# Patient Record
Sex: Male | Born: 1985 | Race: White | Hispanic: No | Marital: Married | Smoking: Current some day smoker
Health system: Southern US, Community
[De-identification: ages and names within clinical notes are randomized; demographics above are authoritative.]

## PROBLEM LIST (undated history)

## (undated) DIAGNOSIS — N200 Calculus of kidney: Secondary | ICD-10-CM

## (undated) HISTORY — DX: Calculus of kidney: N20.0

## (undated) HISTORY — DX: Morbid (severe) obesity due to excess calories: E66.01

---

## 2009-02-21 ENCOUNTER — Emergency Department: Payer: Self-pay | Admitting: Emergency Medicine

## 2015-05-27 ENCOUNTER — Telehealth: Payer: Self-pay | Admitting: Family Medicine

## 2015-05-27 NOTE — Telephone Encounter (Signed)
I do not see this in the patient's PP chart. I this the correct patient?

## 2015-05-27 NOTE — Telephone Encounter (Signed)
Refill Buprioprion [employer].

## 2015-05-30 MED ORDER — NALTREXONE-BUPROPION HCL ER 8-90 MG PO TB12: 2.0000 | ORAL_TABLET | Freq: Two times a day (BID) | ORAL | Status: DC

## 2015-05-30 NOTE — Telephone Encounter (Signed)
Unless there is another Constance Holster with same dob and phone number.  It is in his Epic chart.  I don't know if Dr Laural Benes was the original prescribe but pt said Dr Laural Benes told him to call when he was running low and she would refill it.

## 2015-05-30 NOTE — Telephone Encounter (Signed)
Rx sent to his pharmacy. Contrave. Not wellbutrin.

## 2015-12-21 ENCOUNTER — Ambulatory Visit (INDEPENDENT_AMBULATORY_CARE_PROVIDER_SITE_OTHER): Payer: Self-pay | Admitting: Unknown Physician Specialty

## 2015-12-21 ENCOUNTER — Encounter: Payer: Self-pay | Admitting: Unknown Physician Specialty

## 2015-12-21 VITALS — BP 139/84 | HR 94 | Temp 98.3°F | Ht 67.7 in | Wt 307.6 lb

## 2015-12-21 DIAGNOSIS — Z0000 Encounter for general adult medical examination without abnormal findings: Secondary | ICD-10-CM

## 2015-12-21 LAB — URINALYSIS, DIPSTICK ONLY
Bilirubin, UA: NEGATIVE
Glucose, UA: NEGATIVE
Ketones, UA: NEGATIVE
Leukocytes, UA: NEGATIVE
Nitrite, UA: NEGATIVE
Protein, UA: NEGATIVE
RBC, UA: NEGATIVE
Specific Gravity, UA: 1.02 (ref 1.005–1.030)
Urobilinogen, Ur: 0.2 mg/dL (ref 0.2–1.0)
pH, UA: 6 (ref 5.0–7.5)

## 2015-12-21 NOTE — Progress Notes (Signed)
   BP 139/84 mmHg  Pulse 94  Temp(Src) 98.3 F (36.8 C)  Ht 5' 7.7" (1.72 m)  Wt 307 lb 9.6 oz (139.526 kg)  BMI 47.16 kg/m2  SpO2 98%   Subjective:    Patient ID: Edward Price, male    DOB: [birth year], 30 y.o.   MRN: [id number]  HPI: Edward Price is a 30 y.o. male  Chief Complaint  Patient presents with  . Employment Physical    DOT physical     Relevant past medical, surgical, family and social history reviewed and updated as indicated. Interim medical history since our last visit reviewed. Allergies and medications reviewed and updated.  Review of Systems  Per HPI unless specifically indicated above     Objective:    BP 139/84 mmHg  Pulse 94  Temp(Src) 98.3 F (36.8 C)  Ht 5' 7.7" (1.72 m)  Wt 307 lb 9.6 oz (139.526 kg)  BMI 47.16 kg/m2  SpO2 98%  Wt Readings from Last 3 Encounters:  12/21/15 307 lb 9.6 oz (139.526 kg)  09/14/14 298 lb (135.172 kg)    Physical Exam  Constitutional: He is oriented to person, place, and time. He appears well-developed and well-nourished.  HENT:  Head: Normocephalic.  Right Ear: Tympanic membrane, external ear and ear canal normal.  Left Ear: Tympanic membrane, external ear and ear canal normal.  Mouth/Throat: Uvula is midline, oropharynx is clear and moist and mucous membranes are normal.  Eyes: Pupils are equal, round, and reactive to light.  Cardiovascular: Normal rate, regular rhythm and normal heart sounds.  Exam reveals no gallop and no friction rub.   No murmur heard. Pulmonary/Chest: Effort normal and breath sounds normal. No respiratory distress.  Abdominal: Soft. Bowel sounds are normal. He exhibits no distension. There is no tenderness.  Musculoskeletal: Normal range of motion.  Neurological: He is alert and oriented to person, place, and time. He has normal reflexes.  Skin: Skin is warm and dry.  Psychiatric: He has a normal mood and affect. His behavior is normal. Judgment and thought content normal.    No  results found for this or any previous visit.    Assessment & Plan:   Problem List Items Addressed This Visit    None    Visit Diagnoses    Routine general medical examination at a health care facility    -  Primary    Relevant Orders    Urinalysis, dipstick only       DOT (see form)  Follow up plan: No Follow-up on file.

## 2016-03-21 ENCOUNTER — Encounter: Payer: Self-pay | Admitting: Family Medicine

## 2016-03-21 ENCOUNTER — Ambulatory Visit (INDEPENDENT_AMBULATORY_CARE_PROVIDER_SITE_OTHER): Payer: BLUE CROSS/BLUE SHIELD | Admitting: Family Medicine

## 2016-03-21 VITALS — BP 132/82 | HR 80 | Temp 98.5°F | Wt 311.0 lb

## 2016-03-21 DIAGNOSIS — M2550 Pain in unspecified joint: Secondary | ICD-10-CM | POA: Diagnosis not present

## 2016-03-21 MED ORDER — NALTREXONE-BUPROPION HCL ER 8-90 MG PO TB12: 2.0000 | ORAL_TABLET | Freq: Two times a day (BID) | ORAL | Status: DC

## 2016-03-21 MED ORDER — MELOXICAM 7.5 MG PO TABS: 7.5000 mg | ORAL_TABLET | Freq: Every day | ORAL | Status: DC

## 2016-03-21 NOTE — Patient Instructions (Signed)
Follow up as needed

## 2016-03-21 NOTE — Progress Notes (Signed)
BP 132/82 mmHg  Pulse 80  Temp(Src) 98.5 F (36.9 C)  Wt 311 lb (141.069 kg)  SpO2 98%   Subjective:    Patient ID: Edward Price, male    DOB: [birth year], 31 y.o.   MRN: [id number]  HPI: Edward Price is a 30 y.o. male  Chief Complaint  Patient presents with  . Hand Pain    bilateral hands x 2 months. right hand is worse, radiates into right arm and all the way down into the hand. numbness and tingling. If he doesn't use his hands they swell and get more painful. No known injury.   B/l hand pain x 2 months that becomes much worse with rest. Never happened to him before. 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 The pain begins up around shoulders and comes all the way down arm to entire hand. Some numbness and tingling when swelling is at it's peak. Right side worse than left. Early morning is the worst. Has tried 2 ibuprofen and a heating pad applied to hands, gets relief from that.  Denies fever, fatigue, rashes, GI complaints.   Has also noted significant weight gain since being off Contrave and grieving the loss of several family members recently. Did very well on contrave last year and was able to lose about 40 lb on it. Would like to get back on the medication at this time.   Fhx - MGF and mother both have arthritis, unsure what type.    Relevant past medical, surgical, family and social history reviewed and updated as indicated. Interim medical history since our last visit reviewed. Allergies and medications reviewed and updated.  Review of Systems  Constitutional: Negative.   Eyes: Negative.   Respiratory: Negative.   Cardiovascular: Negative.   Gastrointestinal: Negative.   Musculoskeletal: Positive for joint swelling and arthralgias.  Skin: Negative.   Neurological: Positive for numbness. Negative for weakness.  Psychiatric/Behavioral: Negative.     Per HPI  unless specifically indicated above     Objective:    BP 132/82 mmHg  Pulse 80  Temp(Src) 98.5 F (36.9 C)  Wt 311 lb (141.069 kg)  SpO2 98%  Wt Readings from Last 3 Encounters:  03/21/16 311 lb (141.069 kg)  12/21/15 307 lb 9.6 oz (139.526 kg)  09/14/14 298 lb (135.172 kg)    Physical Exam  Constitutional: He is oriented to person, place, and time. He appears well-developed and well-nourished. No distress.  HENT:  Head: Atraumatic.  Eyes: Conjunctivae are normal. No scleral icterus.  Neck: Normal range of motion. Neck supple.  Cardiovascular: Normal rate and normal heart sounds.   Pulmonary/Chest: Effort normal.  Abdominal: Soft.  Musculoskeletal: Normal range of motion. He exhibits edema (b/l hands mildly edematous diffusely).  Neurological: He is alert and oriented to person, place, and time.  Skin: Skin is warm and dry. No rash noted.  Psychiatric: He has a normal mood and affect. His behavior is normal.  Nursing note and vitals reviewed.   Results for orders placed or performed in visit on 12/21/15  Urinalysis, dipstick only  Result Value Ref Range   Specific Gravity, UA 1.020 1.005 - 1.030   pH, UA 6.0 5.0 - 7.5   Color, UA Yellow Yellow   Appearance Ur Cloudy (A) Clear   Leukocytes, UA Negative Negative   Protein, UA Negative Negative/Trace   Glucose, UA Negative  Negative   Ketones, UA Negative Negative   RBC, UA Negative Negative   Bilirubin, UA Negative Negative   Urobilinogen, Ur 0.2 0.2 - 1.0 mg/dL   Nitrite, UA Negative Negative      Assessment & Plan:   Problem List Items Addressed This Visit      Other   Morbid obesity ([place])   Relevant Medications   Naltrexone-Bupropion HCl ER 8-90 MG TB12    Other Visit Diagnoses    Joint pain    -  Primary    Physical exam non-specific - await lab results, 7.5 mg meloxicam sent - pt will take two tablets daily if tolerated. Cont. heating pad     Relevant Orders    Rheumatoid Factor    Antinuclear Antib  (ANA)    Sed Rate (ESR)    CBC with Differential/Platelet    CYCLIC CITRUL PEPTIDE ANTIBODY, IGG/IGA      Pt states he is very sensitive to medications so will start him on a trial of 7.8m meloxicam and if well tolerated, he can try taking two. Discussed to stop ibuprofen and all other OTC pain relievers other than tylenol while on it.   Consider rheumatology referral if no relief and/or positive lab results.   Follow up plan: No Follow-up on file.

## 2016-03-22 LAB — CBC WITH DIFFERENTIAL/PLATELET
BASOS: 0 %
Basophils Absolute: 0 10*3/uL (ref 0.0–0.2)
EOS (ABSOLUTE): 0.2 10*3/uL (ref 0.0–0.4)
Eos: 2 %
HEMATOCRIT: 47.5 % (ref 37.5–51.0)
Hemoglobin: 16.1 g/dL (ref 12.6–17.7)
Immature Grans (Abs): 0 10*3/uL (ref 0.0–0.1)
Immature Granulocytes: 0 %
LYMPHS ABS: 3.2 10*3/uL — AB (ref 0.7–3.1)
Lymphs: 41 %
MCH: 30.3 pg (ref 26.6–33.0)
MCHC: 33.9 g/dL (ref 31.5–35.7)
MCV: 89 fL (ref 79–97)
MONOS ABS: 0.6 10*3/uL (ref 0.1–0.9)
Monocytes: 8 %
NEUTROS PCT: 49 %
Neutrophils Absolute: 3.9 10*3/uL (ref 1.4–7.0)
Platelets: 294 10*3/uL (ref 150–379)
RBC: 5.32 x10E6/uL (ref 4.14–5.80)
RDW: 13.9 % (ref 12.3–15.4)
WBC: 7.9 10*3/uL (ref 3.4–10.8)

## 2016-03-22 LAB — SEDIMENTATION RATE: SED RATE: 4 mm/h (ref 0–15)

## 2016-03-22 LAB — ANA: Anti Nuclear Antibody(ANA): NEGATIVE

## 2016-03-22 LAB — RHEUMATOID FACTOR: Rhuematoid fact SerPl-aCnc: <10 IU/mL (ref 0.0–13.9)

## 2016-03-23 ENCOUNTER — Encounter: Payer: Self-pay | Admitting: Family Medicine

## 2016-03-23 ENCOUNTER — Telehealth: Payer: Self-pay

## 2016-03-23 LAB — CYCLIC CITRUL PEPTIDE ANTIBODY, IGG/IGA: Cyclic Citrullin Peptide Ab: 8 units (ref 0–19)

## 2016-03-23 NOTE — Telephone Encounter (Signed)
Will see what Dr. Laural BenesJohnson is comfortable with on Monday. Thanks for trying!

## 2016-03-23 NOTE — Telephone Encounter (Signed)
Received as Drug Change Request from [facility] regarding patient's Contrave. Suggested to change to a different medication because it's not covered by patient's insurance.  I tried to do a Prior Authorization and it was denied. Super hard to get weight loss medication approved unless they have hypothyroidism.   Change patient's contrave to something else or wait till Monday when Dr. Henriette CombsJohnson's back in office?

## 2016-03-26 NOTE — Telephone Encounter (Signed)
Patient notified, he will call insurance company and see what is covered.

## 2016-03-26 NOTE — Telephone Encounter (Signed)
He will need to check with his insurance to see if they will cover anything. Unfortunately, some insurances cover weight loss medicine and some don't. If they don't cover any of them, then he can pay out of pocket with a savings card if he wants to pay that much money, if not we can send him to the weight loss clinic or I can see him specifically for that to discuss other options.

## 2016-03-27 NOTE — Telephone Encounter (Signed)
He will need to come in for an appointment or he can check with the pharmacy.

## 2016-03-27 NOTE — Telephone Encounter (Signed)
Pt states that his insurance company would not recommend a different drug since they do not cover Contrave.  Pt's insurance company advised patient to contact ([place])  the provider to obtain a new prescription.  Please send a new prescription to [facility] that the patient's insurance will cover.

## 2016-03-28 NOTE — Telephone Encounter (Signed)
Appointment scheduled.

## 2016-04-18 ENCOUNTER — Encounter: Payer: Self-pay | Admitting: Family Medicine

## 2016-04-18 ENCOUNTER — Ambulatory Visit (INDEPENDENT_AMBULATORY_CARE_PROVIDER_SITE_OTHER): Payer: BLUE CROSS/BLUE SHIELD | Admitting: Family Medicine

## 2016-04-18 MED ORDER — LORCASERIN HCL 10 MG PO TABS: 1.0000 | ORAL_TABLET | Freq: Two times a day (BID) | ORAL | 1 refills | Status: DC

## 2016-04-18 NOTE — Progress Notes (Signed)
BP 135/84 (BP Location: Left Arm, Patient Position: Sitting, Cuff Size: Large)   Pulse 80   Temp 98.7 F (37.1 C)   Wt (!) 313 lb (142 kg)   SpO2 95%   BMI 48.01 kg/m    Subjective:    Patient ID: Edward Price, male    DOB: 1986-05-22, 30 y.o.   MRN: [id number]  HPI: Edward Price is a 30 y.o. male  Chief Complaint  Patient presents with  . Obesity    Patient's insurance will cover Belviq and Qsymia   WEIGHT GAIN- fell off the wagon when his grandfather passed away. Has been gaining weight again.  Duration: chronic Previous attempts at weight loss: yes Complications of obesity: joint pain  Weight loss goal: 100lbs Weight loss to date: -8 lbs Requesting obesity pharmacotherapy: yes Current weight loss supplements/medications: no Previous weight loss supplements/meds: yes- did very well on contrave, but not covered by his insurance any more  Relevant past medical, surgical, family and social history reviewed and updated as indicated. Interim medical history since our last visit reviewed. Allergies and medications reviewed and updated.  Review of Systems  Constitutional: Negative.   Respiratory: Negative.   Cardiovascular: Negative.   Psychiatric/Behavioral: Negative.     Per HPI unless specifically indicated above     Objective:    BP 135/84 (BP Location: Left Arm, Patient Position: Sitting, Cuff Size: Large)   Pulse 80   Temp 98.7 F (37.1 C)   Wt (!) 313 lb (142 kg)   SpO2 95%   BMI 48.01 kg/m   Wt Readings from Last 3 Encounters:  04/18/16 (!) 313 lb (142 kg)  03/21/16 (!) 311 lb (141.1 kg)  12/21/15 (!) 307 lb 9.6 oz (139.5 kg)    Physical Exam  Constitutional: He is oriented to person, place, and time. He appears well-developed and well-nourished. No distress.  HENT:  Head: Normocephalic and atraumatic.  Right Ear: Hearing normal.  Left Ear: Hearing normal.  Nose: Nose normal.  Eyes: Conjunctivae and lids are normal. Right eye exhibits no  discharge. Left eye exhibits no discharge. No scleral icterus.  Pulmonary/Chest: Effort normal. No respiratory distress.  Musculoskeletal: Normal range of motion.  Neurological: He is alert and oriented to person, place, and time.  Skin: Skin is warm, dry and intact. No rash noted. No erythema. No pallor.  Psychiatric: He has a normal mood and affect. His speech is normal and behavior is normal. Judgment and thought content normal. Cognition and memory are normal.  Nursing note and vitals reviewed.   Results for orders placed or performed in visit on 03/21/16  Rheumatoid Factor  Result Value Ref Range   Rhuematoid fact SerPl-aCnc <10.0 0.0 - 13.9 IU/mL  Antinuclear Antib (ANA)  Result Value Ref Range   Anit Nuclear Antibody(ANA) Negative Negative  Sed Rate (ESR)  Result Value Ref Range   Sed Rate 4 0 - 15 mm/hr  CBC with Differential/Platelet  Result Value Ref Range   WBC 7.9 3.4 - 10.8 x10E3/uL   RBC 5.32 4.14 - 5.80 x10E6/uL   Hemoglobin 16.1 12.6 - 17.7 g/dL   Hematocrit 47.5 37.5 - 51.0 %   MCV 89 79 - 97 fL   MCH 30.3 26.6 - 33.0 pg   MCHC 33.9 31.5 - 35.7 g/dL   RDW 13.9 12.3 - 15.4 %   Platelets 294 150 - 379 x10E3/uL   Neutrophils 49 %   Lymphs 41 %   Monocytes 8 %  Eos 2 %   Basos 0 %   Neutrophils Absolute 3.9 1.4 - 7.0 x10E3/uL   Lymphocytes Absolute 3.2 (H) 0.7 - 3.1 x10E3/uL   Monocytes Absolute 0.6 0.1 - 0.9 x10E3/uL   EOS (ABSOLUTE) 0.2 0.0 - 0.4 x10E3/uL   Basophils Absolute 0.0 0.0 - 0.2 x10E3/uL   Immature Granulocytes 0 %   Immature Grans (Abs) 0.0 0.0 - 0.1 P10Y3/EJ  CYCLIC CITRUL PEPTIDE ANTIBODY, IGG/IGA  Result Value Ref Range   Cyclic Citrullin Peptide Ab 8 0 - 19 units      Assessment & Plan:   Problem List Items Addressed This Visit      Other   Morbid obesity ([place]) - Primary    Will get him started on belviq. Risks and benefits discussed. Will check back in in 1 month to determine tolerance.      Relevant Medications   Lorcaserin  HCl (BELVIQ) 10 MG TABS    Other Visit Diagnoses   None.      Follow up plan: Return in about 4 weeks (around 05/16/2016).

## 2016-04-18 NOTE — Patient Instructions (Addendum)
Lorcaserin oral tablets What is this medicine? LORCASERIN (lor ca SER in) is used to promote and maintain weight loss in obese patients. This medicine should be used with a reduced calorie diet and, if appropriate, an exercise program. This medicine may be used for other purposes; ask your health care provider or pharmacist if you have questions. What should I tell my health care provider before I take this medicine? They need to know if you have any of these conditions: -anatomical deformation of the penis, Peyronie's disease, or history of priapism (painful and prolonged erection) -diabetes -heart disease -history of blood diseases, like sickle cell anemia or leukemia -history of irregular heartbeat -kidney disease -liver disease -suicidal thoughts, plans, or attempt; a previous suicide attempt by you or a family member -an unusual or allergic reaction to lorcaserin, other medicines, foods, dyes, or preservatives -pregnant or trying to get pregnant -breast-feeding How should I use this medicine? Take this medicine by mouth with a glass of water. Follow the directions on the prescription label. You can take it with or without food. Take your medicine at regular intervals. Do not take it more often than directed. Do not stop taking except on your doctor's advice. Talk to your pediatrician regarding the use of this medicine in children. Special care may be needed. Overdosage: If you think you have taken too much of this medicine contact a poison control center or emergency room at once. NOTE: This medicine is only for you. Do not share this medicine with others. What if I miss a dose? If you miss a dose, take it as soon as you can. If it is almost time for your next dose, take only that dose. Do not take double or extra doses. What may interact with this medicine? -cabergoline -certain medicines for depression, anxiety, or psychotic disturbances -certain medicines for erectile  dysfunction -certain medicines for migraine headache like almotriptan, eletriptan, frovatriptan, naratriptan, rizatriptan, sumatriptan, zolmitriptan -dextromethorphan -linezolid -lithium -medicines for diabetes -other weight loss products -tramadol -St. John's Wort -stimulant medicines for attention disorders, weight loss, or to stay awake -tryptophan This list may not describe all possible interactions. Give your health care provider a list of all the medicines, herbs, non-prescription drugs, or dietary supplements you use. Also tell them if you smoke, drink alcohol, or use illegal drugs. Some items may interact with your medicine. What should I watch for while using this medicine? This medicine is intended to be used in addition to a healthy diet and appropriate exercise. The best results are achieved this way. Your doctor should instruct you to stop taking this medicine if you do not lose a certain amount of weight within the first 12 weeks of treatment, but it is important that you do not change your dose in any way without consulting your doctor or health care professional. Visit your doctor or health care professional for regular checkups. Your doctor may order blood tests or other tests to see how you are doing. Do not drive, use machinery, or do anything that needs mental alertness until you know how this medicine affects you. This medicine may affect blood sugar levels. If you have diabetes, check with your doctor or health care professional before you change your diet or the dose of your diabetic medicine. Patients and their families should watch out for worsening depression or thoughts of suicide. Also watch out for sudden changes in feelings such as feeling anxious, agitated, panicky, irritable, hostile, aggressive, impulsive, severely restless, overly excited and hyperactive, or   not being able to sleep. If this happens, especially at the beginning of treatment or after a change in dose,  call your health care professional. Contact your doctor or health care professional right away if you are a man with an erection that lasts longer than 4 hours or if the erection becomes painful. This may be a sign of serious problem and must be treated right away to prevent permanent damage. What side effects may I notice from receiving this medicine? Side effects that you should report to your doctor or health care professional as soon as possible: -allergic reactions like skin rash, itching or hives, swelling of the face, lips, or tongue -abnormal production of milk -breast enlargement in both males and females -breathing problems -changes in emotions or moods -changes in vision -confusion -erection lasting more than 4 hours or a painful erection -fast or irregular heart beat -feeling faint or lightheaded, falls -fever or chills, sore throat -hallucination, loss of contact with reality -high or low blood pressure -menstrual changes -restlessness -slow or irregular heartbeat -stiff muscles -sweating -suicidal thoughts or other mood changes -swelling of the ankles, feet, hands -unusually weak or tired -vomiting Side effects that usually do not require medical attention (Report these to your doctor or health care professional if they continue or are bothersome.): -back pain -constipation -cough -dry mouth -nausea -tiredness This list may not describe all possible side effects. Call your doctor for medical advice about side effects. You may report side effects to FDA at 1-800-FDA-1088. Where should I keep my medicine? Keep out of the reach of children. This medicine can be abused. Keep your medicine in a safe place to protect it from theft. Do not share this medicine with anyone. Selling or giving away this medicine is dangerous and against the law. Store at room temperature between 15 and 30 degrees C (59 and 86 degrees F). Throw away any unused medicine after the expiration  date. NOTE: This sheet is a summary. It may not cover all possible information. If you have questions about this medicine, talk to your doctor, pharmacist, or health care provider.    2016, [employer]vier/[employer] Standard. (2015-03-28 16:21:05)  

## 2016-04-18 NOTE — Assessment & Plan Note (Signed)
Will get him started on belviq. Risks and benefits discussed. Will check back in in 1 month to determine tolerance.

## 2016-05-14 ENCOUNTER — Telehealth: Payer: BLUE CROSS/BLUE SHIELD | Admitting: Family

## 2016-05-14 DIAGNOSIS — J208 Acute bronchitis due to other specified organisms: Secondary | ICD-10-CM

## 2016-05-14 MED ORDER — BENZONATATE 100 MG PO CAPS: 100.0000 mg | ORAL_CAPSULE | Freq: Two times a day (BID) | ORAL | 0 refills | Status: DC | PRN

## 2016-05-14 NOTE — Progress Notes (Signed)

## 2016-05-23 ENCOUNTER — Ambulatory Visit: Payer: BLUE CROSS/BLUE SHIELD | Admitting: Family Medicine

## 2016-06-07 ENCOUNTER — Ambulatory Visit: Payer: BLUE CROSS/BLUE SHIELD | Admitting: Family Medicine

## 2016-10-02 ENCOUNTER — Ambulatory Visit (INDEPENDENT_AMBULATORY_CARE_PROVIDER_SITE_OTHER): Payer: BLUE CROSS/BLUE SHIELD | Admitting: Family Medicine

## 2016-10-02 ENCOUNTER — Encounter: Payer: Self-pay | Admitting: Family Medicine

## 2016-10-02 VITALS — BP 124/78 | HR 79 | Temp 98.0°F | Ht 68.5 in | Wt 316.9 lb

## 2016-10-02 DIAGNOSIS — Z23 Encounter for immunization: Secondary | ICD-10-CM

## 2016-10-02 DIAGNOSIS — Z6841 Body Mass Index (BMI) 40.0 and over, adult: Secondary | ICD-10-CM | POA: Diagnosis not present

## 2016-10-02 DIAGNOSIS — Z0000 Encounter for general adult medical examination without abnormal findings: Secondary | ICD-10-CM

## 2016-10-02 LAB — UA/M W/RFLX CULTURE, ROUTINE
BILIRUBIN UA: NEGATIVE
KETONES UA: NEGATIVE
LEUKOCYTES UA: NEGATIVE
Nitrite, UA: NEGATIVE
Protein, UA: NEGATIVE
RBC UA: NEGATIVE
SPEC GRAV UA: 1.02 (ref 1.005–1.030)
Urobilinogen, Ur: 0.2 mg/dL (ref 0.2–1.0)
pH, UA: 6.5 (ref 5.0–7.5)

## 2016-10-02 LAB — MICROSCOPIC EXAMINATION
Epithelial Cells (non renal): NONE SEEN /hpf (ref 0–10)
RBC, UA: NONE SEEN /hpf (ref 0–?)
WBC, UA: NONE SEEN /hpf (ref 0–?)

## 2016-10-02 MED ORDER — NALTREXONE-BUPROPION HCL ER 8-90 MG PO TB12: ORAL_TABLET | ORAL | 3 refills | Status: DC

## 2016-10-02 NOTE — Assessment & Plan Note (Signed)
Did not do well with belviq- no benefit. Did great with contrave previously. Will try it again. Rx sent to his pharmacy. Recheck tolerance and weight loss in 1 month. Work on diet and exercise.

## 2016-10-02 NOTE — Progress Notes (Signed)
BP 124/78 (BP Location: Left Arm, Patient Position: Sitting, Cuff Size: Large)   Pulse 79   Temp 98 F (36.7 C)   Ht 5' 8.5" (1.74 m)   Wt (!) 316 lb 14.4 oz (143.7 kg)   SpO2 96%   BMI 47.48 kg/m    Subjective:    Patient ID: Edward Price, male    DOB: 1986/06/13, 31 y.o.   MRN: [id number]  HPI: Edward Price is a 31 y.o. male presenting on 10/02/2016 for comprehensive medical examination. Current medical complaints include:  WEIGHT GAIN Duration: chronic Previous attempts at weight loss: yes Complications of obesity:  Peak weight: 320s Weight loss goal: 70lbs Weight loss to date:  none Requesting obesity pharmacotherapy: yes Current weight loss supplements/medications: no Previous weight loss supplements/meds: yes  He currently lives with: Wife Interim Problems from his last visit: no  Depression Screen done today and results listed below:  Depression screen [facility] 2/9 10/02/2016 04/18/2016  Decreased Interest 0 0  Down, Depressed, Hopeless 0 0  PHQ - 2 Score 0 0  Altered sleeping 0 0  Tired, decreased energy 0 0  Change in appetite 1 2  Feeling bad or failure about yourself  0 0  Trouble concentrating 0 0  Moving slowly or fidgety/restless 0 0  Suicidal thoughts 0 0  PHQ-9 Score 1 2    Past Medical History:  Past Medical History:  Diagnosis Date  . Morbid obesity ([place])   . Nephrolithiasis     Surgical History:  No past surgical history on file.  Medications:  No current outpatient prescriptions on file prior to visit.   No current facility-administered medications on file prior to visit.     Allergies:  No Known Allergies  Social History:  Social History   Social History  . Marital status: Married    Spouse name: N/A  . Number of children: N/A  . Years of education: N/A   Occupational History  . Not on file.   Social History Main Topics  . Smoking status: Former Smoker    Types: Cigarettes  . Smokeless tobacco: Current User    Types:  Chew  . Alcohol use No  . Drug use: No  . Sexual activity: Yes   Other Topics Concern  . Not on file   Social History Narrative  . No narrative on file   History  Smoking Status  . Former Smoker  . Types: Cigarettes  Smokeless Tobacco  . Current User  . Types: Chew   History  Alcohol Use No    Family History:  Family History  Problem Relation Age of Onset  . Diabetes Mother   . Hypertension Mother   . Diabetes Father   . Hypertension Father   . Heart disease Maternal Grandmother   . Hypertension Maternal Grandmother   . Cancer Maternal Grandfather     liver  . Heart disease Maternal Grandfather   . Hypertension Maternal Grandfather   . Hypertension Paternal Grandmother   . Hypertension Paternal Grandfather   . Stroke Neg Hx   . COPD Neg Hx     Past medical history, surgical history, medications, allergies, family history and social history reviewed with patient today and changes made to appropriate areas of the chart.   Review of Systems  Constitutional: Negative.   HENT: Negative.   Eyes: Negative.   Respiratory: Negative.   Cardiovascular: Negative.   Gastrointestinal: Negative.   Genitourinary: Negative.   Musculoskeletal: Negative.   Skin:  Negative.   Neurological: Positive for tingling. Negative for dizziness, tremors, sensory change, speech change, focal weakness, seizures, loss of consciousness and headaches.  Endo/Heme/Allergies: Negative.   Psychiatric/Behavioral: Negative for depression, hallucinations, memory loss, substance abuse and suicidal ideas. The patient is nervous/anxious. The patient does not have insomnia.     All other ROS negative except what is listed above and in the HPI.      Objective:    BP 124/78 (BP Location: Left Arm, Patient Position: Sitting, Cuff Size: Large)   Pulse 79   Temp 98 F (36.7 C)   Ht 5' 8.5" (1.74 m)   Wt (!) 316 lb 14.4 oz (143.7 kg)   SpO2 96%   BMI 47.48 kg/m   Wt Readings from Last 3  Encounters:  10/02/16 (!) 316 lb 14.4 oz (143.7 kg)  04/18/16 (!) 313 lb (142 kg)  03/21/16 (!) 311 lb (141.1 kg)    Physical Exam  Constitutional: He is oriented to person, place, and time. He appears well-developed and well-nourished. No distress.  HENT:  Head: Normocephalic and atraumatic.  Right Ear: Hearing normal.  Left Ear: Hearing normal.  Nose: Nose normal.  Eyes: Conjunctivae and lids are normal. Right eye exhibits no discharge. Left eye exhibits no discharge. No scleral icterus.  Pulmonary/Chest: Effort normal. No respiratory distress.  Musculoskeletal: Normal range of motion.  Neurological: He is alert and oriented to person, place, and time. He has normal reflexes. He displays normal reflexes. No cranial nerve deficit. He exhibits normal muscle tone. Coordination normal.  Skin: Skin is warm, dry and intact. No rash noted. No erythema. No pallor.  Psychiatric: He has a normal mood and affect. His speech is normal and behavior is normal. Judgment and thought content normal. Cognition and memory are normal.    Results for orders placed or performed in visit on 03/21/16  Rheumatoid Factor  Result Value Ref Range   Rhuematoid fact SerPl-aCnc <10.0 0.0 - 13.9 IU/mL  Antinuclear Antib (ANA)  Result Value Ref Range   Anit Nuclear Antibody(ANA) Negative Negative  Sed Rate (ESR)  Result Value Ref Range   Sed Rate 4 0 - 15 mm/hr  CBC with Differential/Platelet  Result Value Ref Range   WBC 7.9 3.4 - 10.8 x10E3/uL   RBC 5.32 4.14 - 5.80 x10E6/uL   Hemoglobin 16.1 12.6 - 17.7 g/dL   Hematocrit 80.8 13.8 - 51.0 %   MCV 89 79 - 97 fL   MCH 30.3 26.6 - 33.0 pg   MCHC 33.9 31.5 - 35.7 g/dL   RDW 40.2 01.1 - 46.6 %   Platelets 294 150 - 379 x10E3/uL   Neutrophils 49 %   Lymphs 41 %   Monocytes 8 %   Eos 2 %   Basos 0 %   Neutrophils Absolute 3.9 1.4 - 7.0 x10E3/uL   Lymphocytes Absolute 3.2 (H) 0.7 - 3.1 x10E3/uL   Monocytes Absolute 0.6 0.1 - 0.9 x10E3/uL   EOS (ABSOLUTE)  0.2 0.0 - 0.4 x10E3/uL   Basophils Absolute 0.0 0.0 - 0.2 x10E3/uL   Immature Granulocytes 0 %   Immature Grans (Abs) 0.0 0.0 - 0.1 x10E3/uL  CYCLIC CITRUL PEPTIDE ANTIBODY, IGG/IGA  Result Value Ref Range   Cyclic Citrullin Peptide Ab 8 0 - 19 units      Assessment & Plan:   Problem List Items Addressed This Visit      Other   Morbid obesity ([facility])    Did not do well with belviq- no benefit.  Did great with contrave previously. Will try it again. Rx sent to his pharmacy. Recheck tolerance and weight loss in 1 month. Work on diet and exercise.      Relevant Medications   Naltrexone-Bupropion HCl ER 8-90 MG TB12    Other Visit Diagnoses    Routine general medical examination at a health care facility    -  Primary   Up to date on vaccines. Screening labs checked today. Continue diet and exercise. Call with any concerns.    Relevant Orders   CBC with Differential/Platelet   Comprehensive metabolic panel   Lipid Panel w/o Chol/HDL Ratio   TSH   UA/M w/rflx Culture, Routine   Need for influenza vaccination       Flu shot given today.   Relevant Orders   Flu Vaccine QUAD 36+ mos PF IM (Fluarix & Fluzone Quad PF) (Completed)       LABORATORY TESTING:  Health maintenance labs ordered today as discussed above.   IMMUNIZATIONS:   - Tdap: Tetanus vaccination status reviewed: last tetanus booster within 10 years. - Influenza: Administered today - Pneumovax: Not applicable  SCREENING: - Colonoscopy: Not applicable  Discussed with patient purpose of the colonoscopy is to detect colon cancer at curable precancerous or early stages   PATIENT COUNSELING:    Sexuality: Discussed sexually transmitted diseases, partner selection, use of condoms, avoidance of unintended pregnancy  and contraceptive alternatives.   Advised to avoid cigarette smoking.  I discussed with the patient that most people either abstain from alcohol or drink within safe limits (<=14/week and <=4  drinks/occasion for males, <=7/weeks and <= 3 drinks/occasion for females) and that the risk for alcohol disorders and other health effects rises proportionally with the number of drinks per week and how often a drinker exceeds daily limits.  Discussed cessation/primary prevention of drug use and availability of treatment for abuse.   Diet: Encouraged to adjust caloric intake to maintain  or achieve ideal body weight, to reduce intake of dietary saturated fat and total fat, to limit sodium intake by avoiding high sodium foods and not adding table salt, and to maintain adequate dietary potassium and calcium preferably from fresh fruits, vegetables, and low-fat dairy products.    stressed the importance of regular exercise  Injury prevention: Discussed safety belts, safety helmets, smoke detector, smoking near bedding or upholstery.   Dental health: Discussed importance of regular tooth brushing, flossing, and dental visits.   Follow up plan: NEXT PREVENTATIVE PHYSICAL DUE IN 1 YEAR. Return in about 4 weeks (around 10/30/2016) for Follow up Contrave.

## 2016-10-02 NOTE — Patient Instructions (Addendum)
Health Maintenance, Male A healthy lifestyle and preventative care can promote health and wellness.  Maintain regular health, dental, and eye exams.  Eat a healthy diet. Foods like vegetables, fruits, whole grains, low-fat dairy products, and lean protein foods contain the nutrients you need and are low in calories. Decrease your intake of foods high in solid fats, added sugars, and salt. Get information about a proper diet from your health care provider, if necessary.  Regular physical exercise is one of the most important things you can do for your health. Most adults should get at least 150 minutes of moderate-intensity exercise (any activity that increases your heart rate and causes you to sweat) each week. In addition, most adults need muscle-strengthening exercises on 2 or more days a week.   Maintain a healthy weight. The body mass index (BMI) is a screening tool to identify possible weight problems. It provides an estimate of body fat based on height and weight. Your health care provider can find your BMI and can help you achieve or maintain a healthy weight. For males 20 years and older:  A BMI below 18.5 is considered underweight.  A BMI of 18.5 to 24.9 is normal.  A BMI of 25 to 29.9 is considered overweight.  A BMI of 30 and above is considered obese.  Maintain normal blood lipids and cholesterol by exercising and minimizing your intake of saturated fat. Eat a balanced diet with plenty of fruits and vegetables. Blood tests for lipids and cholesterol should begin at age 20 and be repeated every 5 years. If your lipid or cholesterol levels are high, you are over age 50, or you are at high risk for heart disease, you may need your cholesterol levels checked more frequently.Ongoing high lipid and cholesterol levels should be treated with medicines if diet and exercise are not working.  If you smoke, find out from your health care provider how to quit. If you do not use tobacco, do not  start.  Lung cancer screening is recommended for adults aged 55-80 years who are at high risk for developing lung cancer because of a history of smoking. A yearly low-dose CT scan of the lungs is recommended for people who have at least a 30-pack-year history of smoking and are current smokers or have quit within the past 15 years. A pack year of smoking is smoking an average of 1 pack of cigarettes a day for 1 year (for example, a 30-pack-year history of smoking could mean smoking 1 pack a day for 30 years or 2 packs a day for 15 years). Yearly screening should continue until the smoker has stopped smoking for at least 15 years. Yearly screening should be stopped for people who develop a health problem that would prevent them from having lung cancer treatment.  If you choose to drink alcohol, do not have more than 2 drinks per day. One drink is considered to be 12 oz (360 mL) of beer, 5 oz (150 mL) of wine, or 1.5 oz (45 mL) of liquor.  Avoid the use of street drugs. Do not share needles with anyone. Ask for help if you need support or instructions about stopping the use of drugs.  High blood pressure causes heart disease and increases the risk of stroke. High blood pressure is more likely to develop in:  People who have blood pressure in the end of the normal range (100-139/85-89 mm Hg).  People who are overweight or obese.  People who are African American.    If you are 18-39 years of age, have your blood pressure checked every 3-5 years. If you are 40 years of age or older, have your blood pressure checked every year. You should have your blood pressure measured twice-once when you are at a hospital or clinic, and once when you are not at a hospital or clinic. Record the average of the two measurements. To check your blood pressure when you are not at a hospital or clinic, you can use:  An automated blood pressure machine at a pharmacy.  A home blood pressure monitor.  If you are 45-79 years  old, ask your health care provider if you should take aspirin to prevent heart disease.  Diabetes screening involves taking a blood sample to check your fasting blood sugar level. This should be done once every 3 years after age 45 if you are at a normal weight and without risk factors for diabetes. Testing should be considered at a younger age or be carried out more frequently if you are overweight and have at least 1 risk factor for diabetes.  Colorectal cancer can be detected and often prevented. Most routine colorectal cancer screening begins at the age of 50 and continues through age 75. However, your health care provider may recommend screening at an earlier age if you have risk factors for colon cancer. On a yearly basis, your health care provider may provide home test kits to check for hidden blood in the stool. A small camera at the end of a tube may be used to directly examine the colon (sigmoidoscopy or colonoscopy) to detect the earliest forms of colorectal cancer. Talk to your health care provider about this at age 50 when routine screening begins. A direct exam of the colon should be repeated every 5-10 years through age 75, unless early forms of precancerous polyps or small growths are found.  People who are at an increased risk for hepatitis B should be screened for this virus. You are considered at high risk for hepatitis B if:  You were born in a country where hepatitis B occurs often. Talk with your health care provider about which countries are considered high risk.  Your parents were born in a high-risk country and you have not received a shot to protect against hepatitis B (hepatitis B vaccine).  You have HIV or AIDS.  You use needles to inject street drugs.  You live with, or have sex with, someone who has hepatitis B.  You are a man who has sex with other men (MSM).  You get hemodialysis treatment.  You take certain medicines for conditions like cancer, organ  transplantation, and autoimmune conditions.  Hepatitis C blood testing is recommended for all people born from 1945 through 1965 and any individual with known risk factors for hepatitis C.  Healthy men should no longer receive prostate-specific antigen (PSA) blood tests as part of routine cancer screening. Talk to your health care provider about prostate cancer screening.  Testicular cancer screening is not recommended for adolescents or adult males who have no symptoms. Screening includes self-exam, a health care provider exam, and other screening tests. Consult with your health care provider about any symptoms you have or any concerns you have about testicular cancer.  Practice safe sex. Use condoms and avoid high-risk sexual practices to reduce the spread of sexually transmitted infections (STIs).  You should be screened for STIs, including gonorrhea and chlamydia if:  You are sexually active and are younger than 24 years.  You   are older than 24 years, and your health care provider tells you that you are at risk for this type of infection.  Your sexual activity has changed since you were last screened, and you are at an increased risk for chlamydia or gonorrhea. Ask your health care provider if you are at risk.  If you are at risk of being infected with HIV, it is recommended that you take a prescription medicine daily to prevent HIV infection. This is called pre-exposure prophylaxis (PrEP). You are considered at risk if:  You are a man who has sex with other men (MSM).  You are a heterosexual man who is sexually active with multiple partners.  You take drugs by injection.  You are sexually active with a partner who has HIV.  Talk with your health care provider about whether you are at high risk of being infected with HIV. If you choose to begin PrEP, you should first be tested for HIV. You should then be tested every 3 months for as long as you are taking PrEP.  Use sunscreen. Apply  sunscreen liberally and repeatedly throughout the day. You should seek shade when your shadow is shorter than you. Protect yourself by wearing long sleeves, pants, a wide-brimmed hat, and sunglasses year round whenever you are outdoors.  Tell your health care provider of new moles or changes in moles, especially if there is a change in shape or color. Also, tell your health care provider if a mole is larger than the size of a pencil eraser.  A one-time screening for abdominal aortic aneurysm (AAA) and surgical repair of large AAAs by ultrasound is recommended for men aged 65-75 years who are current or former smokers.  Stay current with your vaccines (immunizations). This information is not intended to replace advice given to you by your health care provider. Make sure you discuss any questions you have with your health care provider. Document Released: 02/16/2008 Document Revised: 09/10/2014 Document Reviewed: 05/24/2015 Elsevier Interactive Patient Education  2017 Elsevier Inc. Influenza (Flu) Vaccine (Inactivated or Recombinant): What You Need to Know 1. Why get vaccinated? Influenza ("flu") is a contagious disease that spreads around the [place] every year, usually between October and May. Flu is caused by influenza viruses, and is spread mainly by coughing, sneezing, and close contact. Anyone can get flu. Flu strikes suddenly and can last several days. Symptoms vary by age, but can include:  fever/chills  sore throat  muscle aches  fatigue  cough  headache  runny or stuffy nose Flu can also lead to pneumonia and blood infections, and cause diarrhea and seizures in children. If you have a medical condition, such as heart or lung disease, flu can make it worse. Flu is more dangerous for some people. Infants and young children, people 65 years of age and older, pregnant women, and people with certain health conditions or a weakened immune system are at greatest risk. Each year  thousands of people in the [place] die from flu, and many more are hospitalized. Flu vaccine can:  keep you from getting flu,  make flu less severe if you do get it, and  keep you from spreading flu to your family and other people. 2. Inactivated and recombinant flu vaccines A dose of flu vaccine is recommended every flu season. Children 6 months through 8 years of age may need two doses during the same flu season. Everyone else needs only one dose each flu season. Some inactivated flu vaccines contain a   very small amount of a mercury-based preservative called thimerosal. Studies have not shown thimerosal in vaccines to be harmful, but flu vaccines that do not contain thimerosal are available. There is no live flu virus in flu shots. They cannot cause the flu. There are many flu viruses, and they are always changing. Each year a new flu vaccine is made to protect against three or four viruses that are likely to cause disease in the upcoming flu season. But even when the vaccine doesn't exactly match these viruses, it may still provide some protection. Flu vaccine cannot prevent:  flu that is caused by a virus not covered by the vaccine, or  illnesses that look like flu but are not. It takes about 2 weeks for protection to develop after vaccination, and protection lasts through the flu season. 3. Some people should not get this vaccine Tell the person who is giving you the vaccine:  If you have any severe, life-threatening allergies. If you ever had a life-threatening allergic reaction after a dose of flu vaccine, or have a severe allergy to any part of this vaccine, you may be advised not to get vaccinated. Most, but not all, types of flu vaccine contain a small amount of egg protein.  If you ever had Guillain-Barr Syndrome (also called GBS). Some people with a history of GBS should not get this vaccine. This should be discussed with your doctor.  If you are not feeling well. It is  usually okay to get flu vaccine when you have a mild illness, but you might be asked to come back when you feel better. 4. Risks of a vaccine reaction With any medicine, including vaccines, there is a chance of reactions. These are usually mild and go away on their own, but serious reactions are also possible. Most people who get a flu shot do not have any problems with it. Minor problems following a flu shot include:  soreness, redness, or swelling where the shot was given  hoarseness  sore, red or itchy eyes  cough  fever  aches  headache  itching  fatigue If these problems occur, they usually begin soon after the shot and last 1 or 2 days. More serious problems following a flu shot can include the following:  There may be a small increased risk of Guillain-Barre Syndrome (GBS) after inactivated flu vaccine. This risk has been estimated at 1 or 2 additional cases per million people vaccinated. This is much lower than the risk of severe complications from flu, which can be prevented by flu vaccine.  Young children who get the flu shot along with pneumococcal vaccine (PCV13) and/or DTaP vaccine at the same time might be slightly more likely to have a seizure caused by fever. Ask your doctor for more information. Tell your doctor if a child who is getting flu vaccine has ever had a seizure. Problems that could happen after any injected vaccine:  People sometimes faint after a medical procedure, including vaccination. Sitting or lying down for about 15 minutes can help prevent fainting, and injuries caused by a fall. Tell your doctor if you feel dizzy, or have vision changes or ringing in the ears.  Some people get severe pain in the shoulder and have difficulty moving the arm where a shot was given. This happens very rarely.  Any medication can cause a severe allergic reaction. Such reactions from a vaccine are very rare, estimated at about 1 in a million doses, and would happen  within a few   minutes to a few hours after the vaccination. As with any medicine, there is a very remote chance of a vaccine causing a serious injury or death. The safety of vaccines is always being monitored. For more information, visit: www.cdc.gov/vaccinesafety/ 5. What if there is a serious reaction? What should I look for? Look for anything that concerns you, such as signs of a severe allergic reaction, very high fever, or unusual behavior. Signs of a severe allergic reaction can include hives, swelling of the face and throat, difficulty breathing, a fast heartbeat, dizziness, and weakness. These would start a few minutes to a few hours after the vaccination. What should I do?  If you think it is a severe allergic reaction or other emergency that can't wait, call 9-1-1 and get the person to the nearest hospital. Otherwise, call your doctor.  Reactions should be reported to the Vaccine Adverse Event Reporting System (VAERS). Your doctor should file this report, or you can do it yourself through the VAERS web site at www.vaers.hhs.gov, or by calling 1-800-822-7967.  VAERS does not give medical advice. 6. The National Vaccine Injury Compensation Program The National Vaccine Injury Compensation Program (VICP) is a federal program that was created to compensate people who may have been injured by certain vaccines. Persons who believe they may have been injured by a vaccine can learn about the program and about filing a claim by calling 1-800-338-2382 or visiting the VICP website at www.hrsa.gov/vaccinecompensation. There is a time limit to file a claim for compensation. 7. How can I learn more?  Ask your healthcare provider. He or she can give you the vaccine package insert or suggest other sources of information.  Call your local or state health department.  Contact the Centers for Disease Control and Prevention (CDC):  Call 1-800-232-4636 (1-800-CDC-INFO) or  Visit CDC's website at  www.cdc.gov/flu Vaccine Information Statement, Inactivated Influenza Vaccine (04/09/2014) This information is not intended to replace advice given to you by your health care provider. Make sure you discuss any questions you have with your health care provider. Document Released: 06/14/2006 Document Revised: 05/10/2016 Document Reviewed: 05/10/2016 Elsevier Interactive Patient Education  2017 Elsevier Inc.  

## 2016-10-03 LAB — LIPID PANEL W/O CHOL/HDL RATIO
Cholesterol, Total: 189 mg/dL (ref 100–199)
HDL: 42 mg/dL (ref >39)
LDL Calculated: 113 mg/dL — ABNORMAL HIGH (ref 0–99)
Triglycerides: 168 mg/dL — ABNORMAL HIGH (ref 0–149)
VLDL Cholesterol Cal: 34 mg/dL (ref 5–40)

## 2016-10-03 LAB — CBC WITH DIFFERENTIAL/PLATELET
BASOS: 0 %
Basophils Absolute: 0 10*3/uL (ref 0.0–0.2)
EOS (ABSOLUTE): 0.1 10*3/uL (ref 0.0–0.4)
EOS: 1 %
Hematocrit: 46.3 % (ref 37.5–51.0)
Hemoglobin: 16 g/dL (ref 13.0–17.7)
IMMATURE GRANS (ABS): 0 10*3/uL (ref 0.0–0.1)
IMMATURE GRANULOCYTES: 0 %
LYMPHS: 43 %
Lymphocytes Absolute: 3.6 10*3/uL — ABNORMAL HIGH (ref 0.7–3.1)
MCH: 30.3 pg (ref 26.6–33.0)
MCHC: 34.6 g/dL (ref 31.5–35.7)
MCV: 88 fL (ref 79–97)
Monocytes Absolute: 0.7 10*3/uL (ref 0.1–0.9)
Monocytes: 8 %
NEUTROS PCT: 48 %
Neutrophils Absolute: 4.1 10*3/uL (ref 1.4–7.0)
Platelets: 300 10*3/uL (ref 150–379)
RBC: 5.28 x10E6/uL (ref 4.14–5.80)
RDW: 14 % (ref 12.3–15.4)
WBC: 8.5 10*3/uL (ref 3.4–10.8)

## 2016-10-03 LAB — COMPREHENSIVE METABOLIC PANEL
A/G RATIO: 1.5 (ref 1.2–2.2)
ALT: 29 IU/L (ref 0–44)
AST: 16 IU/L (ref 0–40)
Albumin: 4.2 g/dL (ref 3.5–5.5)
Alkaline Phosphatase: 105 IU/L (ref 39–117)
BUN/Creatinine Ratio: 12 (ref 9–20)
BUN: 12 mg/dL (ref 6–20)
Bilirubin Total: 0.2 mg/dL (ref 0.0–1.2)
CALCIUM: 9.2 mg/dL (ref 8.7–10.2)
CO2: 25 mmol/L (ref 18–29)
CREATININE: 0.99 mg/dL (ref 0.76–1.27)
Chloride: 102 mmol/L (ref 96–106)
GFR, EST AFRICAN AMERICAN: 116 mL/min/{1.73_m2} (ref >59)
GFR, EST NON AFRICAN AMERICAN: 100 mL/min/{1.73_m2} (ref >59)
Globulin, Total: 2.8 g/dL (ref 1.5–4.5)
Glucose: 86 mg/dL (ref 65–99)
POTASSIUM: 4.6 mmol/L (ref 3.5–5.2)
Sodium: 141 mmol/L (ref 134–144)
TOTAL PROTEIN: 7 g/dL (ref 6.0–8.5)

## 2016-10-03 LAB — TSH: TSH: 1.48 u[IU]/mL (ref 0.450–4.500)

## 2016-10-05 ENCOUNTER — Encounter: Payer: Self-pay | Admitting: Family Medicine

## 2016-10-05 ENCOUNTER — Telehealth: Payer: Self-pay | Admitting: Family Medicine

## 2016-10-05 NOTE — Telephone Encounter (Signed)
Routing to provider. He saw Dr. Laural BenesJohnson, not Fleet Contrasachel.

## 2016-10-05 NOTE — Telephone Encounter (Signed)
Please let him know that we don't prescribe that alternative in this office. I'm writing a letter to his insurance company and we'll see about getting his contrave covered.

## 2016-10-05 NOTE — Telephone Encounter (Signed)
Patient notified. He was very appreciative of her writing the letter to try and get his medicine approved.

## 2016-10-11 ENCOUNTER — Telehealth: Payer: Self-pay | Admitting: Family Medicine

## 2016-10-11 NOTE — Telephone Encounter (Signed)
Patient called to let Dr Laural BenesJohnson know his insurance information regarding the prescription she had to prior auth was incorrect. He called to give the correct insurance information which has been updated today so she may need to do another prior [place].  It [place] [employer] [place] [employer] [place]  Thanks

## 2016-10-17 NOTE — Telephone Encounter (Signed)
Patient notified

## 2016-10-17 NOTE — Telephone Encounter (Signed)
[facility] [place].   "It came back and said PA is not needed for the patient/medication combination in the request."  Will notify patient.

## 2016-10-26 ENCOUNTER — Ambulatory Visit: Payer: BLUE CROSS/BLUE SHIELD | Admitting: Family Medicine

## 2016-11-13 ENCOUNTER — Ambulatory Visit: Payer: Self-pay | Admitting: Family Medicine

## 2017-03-18 ENCOUNTER — Telehealth: Payer: Self-pay | Admitting: Family Medicine

## 2017-03-18 NOTE — Telephone Encounter (Signed)
Appointment scheduled.

## 2017-03-18 NOTE — Telephone Encounter (Signed)
Needs an appointment.

## 2017-03-18 NOTE — Telephone Encounter (Signed)
Spoke with patient, he states that he can not take the Contrave that it is messing with his head. He would like to know if you can change him to something different.

## 2017-04-10 ENCOUNTER — Ambulatory Visit (INDEPENDENT_AMBULATORY_CARE_PROVIDER_SITE_OTHER): Payer: BLUE CROSS/BLUE SHIELD | Admitting: Family Medicine

## 2017-04-10 ENCOUNTER — Encounter: Payer: Self-pay | Admitting: Family Medicine

## 2017-04-10 ENCOUNTER — Ambulatory Visit
Admission: RE | Admit: 2017-04-10 | Discharge: 2017-04-10 | Disposition: A | Discharge status: Home / Self Care | Payer: BLUE CROSS/BLUE SHIELD | Source: Ambulatory Visit | Attending: Family Medicine | Admitting: Family Medicine

## 2017-04-10 DIAGNOSIS — M79674 Pain in right toe(s): Secondary | ICD-10-CM

## 2017-04-10 NOTE — Assessment & Plan Note (Signed)
Will check with work on other medicines he can take. Await his call back. Would like to see bariatrics, but is not interested in any group visits and will not go to anywhere with group visits. Will put in referral today.

## 2017-04-10 NOTE — Patient Instructions (Signed)
Saxenda- check with your insurance (shot) Buproprion- 1/2 of the contrave you were one Topamax- migraine medicine

## 2017-04-10 NOTE — Progress Notes (Signed)
BP 129/84 (BP Location: Left Arm, Patient Position: Sitting, Cuff Size: Large)   Pulse 81   Temp 98.5 F (36.9 C)   Wt (!) 304 lb 4 oz (138 kg)   SpO2 95%   BMI 45.59 kg/m    Subjective:    Patient ID: Edward Price, male    DOB: 05-27-1986, 31 y.o.   MRN: [id number]  HPI: Edward Price is a 31 y.o. male  Chief Complaint  Patient presents with  . Weight Loss   Dropped a 15 gallon drum on his R 5th toe. Hurts a whole lot. He was wearing steal toed boot on his 5th toe a few days ago. Hurts, but he can move it. Pretty swollen. No numbness or tingling.   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 States that it is not safe for him. He would like to continue to lose weight, but can't take belviq due to it making him feel weird. Will check with work to see if he's allowed to drive on saxenda, wellbutrin or topamax. Would like to consider definitive treatment with bariatric surgery. Otherwise doing well with no other concerns or complaints at this time.   Relevant past medical, surgical, family and social history reviewed and updated as indicated. Interim medical history since our last visit reviewed. Allergies and medications reviewed and updated.  Review of Systems  Constitutional: Negative.   Respiratory: Negative.   Cardiovascular: Negative.   Psychiatric/Behavioral: Negative.     Per HPI unless specifically indicated above     Objective:    BP 129/84 (BP Location: Left Arm, Patient Position: Sitting, Cuff Size: Large)   Pulse 81   Temp 98.5 F (36.9 C)   Wt (!) 304 lb 4 oz (138 kg)   SpO2 95%   BMI 45.59 kg/m   Wt Readings from Last 3 Encounters:  04/10/17 (!) 304 lb 4 oz (138 kg)  10/02/16 (!) 316 lb 14.4 oz (143.7 kg)  04/18/16 (!) 313 lb (142 kg)    Physical Exam  Constitutional: He is oriented to person, place, and time. He appears well-developed and well-nourished. No distress.  HENT:  Head: Normocephalic and atraumatic.  Right Ear: Hearing normal.    Left Ear: Hearing normal.  Nose: Nose normal.  Eyes: Conjunctivae and lids are normal. Right eye exhibits no discharge. Left eye exhibits no discharge. No scleral icterus.  Cardiovascular: Normal rate, regular rhythm, normal heart sounds and intact distal pulses.  Exam reveals no gallop and no friction rub.   No murmur heard. Pulmonary/Chest: Effort normal and breath sounds normal. No respiratory distress. He has no wheezes. He has no rales. He exhibits no tenderness.  Musculoskeletal: Normal range of motion.  Tenderness to light squeeze of 5th R toe  Neurological: He is alert and oriented to person, place, and time.  Skin: Skin is warm, dry and intact. No rash noted. He is not diaphoretic. No erythema. No pallor.  Psychiatric: He has a normal mood and affect. His speech is normal and behavior is normal. Judgment and thought content normal. Cognition and memory are normal.  Nursing note and vitals reviewed.   Results for orders placed or performed in visit on 10/02/16  Microscopic Examination  Result Value Ref Range   WBC, UA None seen 0 - 5 /hpf   RBC, UA None seen 0 - 2 /hpf   Epithelial Cells (non renal) None seen 0 - 10 /hpf   Crystals Present (A) N/A  Crystal Type Amorphous Sediment N/A   Bacteria, UA Few (A) None seen/Few  CBC with Differential/Platelet  Result Value Ref Range   WBC 8.5 3.4 - 10.8 x10E3/uL   RBC 5.28 4.14 - 5.80 x10E6/uL   Hemoglobin 16.0 13.0 - 17.7 g/dL   Hematocrit 16.1 09.6 - 51.0 %   MCV 88 79 - 97 fL   MCH 30.3 26.6 - 33.0 pg   MCHC 34.6 31.5 - 35.7 g/dL   RDW 04.5 40.9 - 81.1 %   Platelets 300 150 - 379 x10E3/uL   Neutrophils 48 Not Estab. %   Lymphs 43 Not Estab. %   Monocytes 8 Not Estab. %   Eos 1 Not Estab. %   Basos 0 Not Estab. %   Neutrophils Absolute 4.1 1.4 - 7.0 x10E3/uL   Lymphocytes Absolute 3.6 (H) 0.7 - 3.1 x10E3/uL   Monocytes Absolute 0.7 0.1 - 0.9 x10E3/uL   EOS (ABSOLUTE) 0.1 0.0 - 0.4 x10E3/uL   Basophils Absolute 0.0 0.0  - 0.2 x10E3/uL   Immature Granulocytes 0 Not Estab. %   Immature Grans (Abs) 0.0 0.0 - 0.1 x10E3/uL  Comprehensive metabolic panel  Result Value Ref Range   Glucose 86 65 - 99 mg/dL   BUN 12 6 - 20 mg/dL   Creatinine, Ser 9.14 0.76 - 1.27 mg/dL   GFR calc non Af Amer 100 >59 mL/min/1.73   GFR calc Af Amer 116 >59 mL/min/1.73   BUN/Creatinine Ratio 12 9 - 20   Sodium 141 134 - 144 mmol/L   Potassium 4.6 3.5 - 5.2 mmol/L   Chloride 102 96 - 106 mmol/L   CO2 25 18 - 29 mmol/L   Calcium 9.2 8.7 - 10.2 mg/dL   Total Protein 7.0 6.0 - 8.5 g/dL   Albumin 4.2 3.5 - 5.5 g/dL   Globulin, Total 2.8 1.5 - 4.5 g/dL   Albumin/Globulin Ratio 1.5 1.2 - 2.2   Bilirubin Total 0.2 0.0 - 1.2 mg/dL   Alkaline Phosphatase 105 39 - 117 IU/L   AST 16 0 - 40 IU/L   ALT 29 0 - 44 IU/L  Lipid Panel w/o Chol/HDL Ratio  Result Value Ref Range   Cholesterol, Total 189 100 - 199 mg/dL   Triglycerides 782 (H) 0 - 149 mg/dL   HDL 42 >95 mg/dL   VLDL Cholesterol Cal 34 5 - 40 mg/dL   LDL Calculated 621 (H) 0 - 99 mg/dL  TSH  Result Value Ref Range   TSH 1.480 0.450 - 4.500 uIU/mL  UA/M w/rflx Culture, Routine  Result Value Ref Range   Specific Gravity, UA 1.020 1.005 - 1.030   pH, UA 6.5 5.0 - 7.5   Color, UA Yellow Yellow   Appearance Ur Clear Clear   Leukocytes, UA Negative Negative   Protein, UA Negative Negative/Trace   Glucose, UA 1+ (A) Negative   Ketones, UA Negative Negative   RBC, UA Negative Negative   Bilirubin, UA Negative Negative   Urobilinogen, Ur 0.2 0.2 - 1.0 mg/dL   Nitrite, UA Negative Negative   Microscopic Examination See below:       Assessment & Plan:   Problem List Items Addressed This Visit      Other   Morbid obesity (HCC) - Primary    Will check with work on other medicines he can take. Await his call back. Would like to see bariatrics, but is not interested in any group visits and will not go to anywhere with group visits. Will  put in referral today.        Relevant Orders   Amb Referral to Bariatric Surgery    Other Visit Diagnoses    Pain of toe of right foot       Buddy taped today. Will obtain x-ray. Await results.    Relevant Orders   DG Toe 5th Right (Completed)       Follow up plan: Return 1 month after starting new meds.

## 2017-10-10 ENCOUNTER — Ambulatory Visit (INDEPENDENT_AMBULATORY_CARE_PROVIDER_SITE_OTHER): Payer: BLUE CROSS/BLUE SHIELD | Admitting: Family Medicine

## 2017-10-10 ENCOUNTER — Encounter: Payer: Self-pay | Admitting: Family Medicine

## 2017-10-10 VITALS — BP 116/82 | HR 85 | Temp 98.7°F | Wt 314.8 lb

## 2017-10-10 DIAGNOSIS — R52 Pain, unspecified: Secondary | ICD-10-CM | POA: Diagnosis not present

## 2017-10-10 DIAGNOSIS — J101 Influenza due to other identified influenza virus with other respiratory manifestations: Secondary | ICD-10-CM | POA: Diagnosis not present

## 2017-10-10 DIAGNOSIS — J0100 Acute maxillary sinusitis, unspecified: Secondary | ICD-10-CM

## 2017-10-10 LAB — VERITOR FLU A/B WAIVED
INFLUENZA A: POSITIVE — AB
INFLUENZA B: NEGATIVE

## 2017-10-10 MED ORDER — BENZONATATE 200 MG PO CAPS: 200.0000 mg | ORAL_CAPSULE | Freq: Two times a day (BID) | ORAL | 0 refills | Status: DC | PRN

## 2017-10-10 MED ORDER — BALOXAVIR MARBOXIL(80 MG DOSE) 2 X 40 MG PO TBPK: 80.0000 mg | ORAL_TABLET | Freq: Every day | ORAL | 0 refills | Status: DC

## 2017-10-10 MED ORDER — SCOPOLAMINE 1 MG/3DAYS TD PT72: 1.0000 | MEDICATED_PATCH | TRANSDERMAL | 0 refills | Status: DC

## 2017-10-10 MED ORDER — HYDROCOD POLST-CPM POLST ER 10-8 MG/5ML PO SUER: 5.0000 mL | Freq: Two times a day (BID) | ORAL | 0 refills | Status: DC | PRN

## 2017-10-10 MED ORDER — AMOXICILLIN-POT CLAVULANATE 875-125 MG PO TABS: 1.0000 | ORAL_TABLET | Freq: Two times a day (BID) | ORAL | 0 refills | Status: DC

## 2017-10-10 NOTE — Progress Notes (Signed)
BP 116/82   Pulse 85   Temp 98.7 F (37.1 C) (Oral)   Wt (!) 314 lb 12.8 oz (142.8 kg)   SpO2 96%   BMI 47.17 kg/m    Subjective:    Patient ID: Edward Price, male    DOB: 18-Mar-1986, 32 y.o.   MRN: [id number]  HPI: Edward Price is a 32 y.o. male  Chief Complaint  Patient presents with  . URI    pt states he has had congestion for the last few days so he started taking mucinex. pt states that yesterday he had chills, body aches, and a fever of 101.3   Sinus pain and pressure, productive cough x over 1 week, now since last night with fever of 101.3, worsening facial pain, tooth pain. Denies CP, SOB, N/V/D. Wife is also sick. Taking ibuprofen and mucinex with minimal relief. Last dose of ibuprofen was this morning, had a fever prior to that. Not UTD on flu vaccine. No hx of pulm dz, former smoker.  Also states he's going on a cruise next month and gets terrible motion sickness. Wanting something called in for that so he can take it on his trip.   Past Medical History:  Diagnosis Date  . Morbid obesity (HCC)   . Nephrolithiasis    Social History   Socioeconomic History  . Marital status: Married    Spouse name: Not on file  . Number of children: Not on file  . Years of education: Not on file  . Highest education level: Not on file  Social Needs  . Financial resource strain: Not on file  . Food insecurity - worry: Not on file  . Food insecurity - inability: Not on file  . Transportation needs - medical: Not on file  . Transportation needs - non-medical: Not on file  Occupational History  . Not on file  Tobacco Use  . Smoking status: Former Smoker    Types: Cigarettes  . Smokeless tobacco: Current User    Types: Chew  Substance and Sexual Activity  . Alcohol use: No  . Drug use: No  . Sexual activity: Yes  Other Topics Concern  . Not on file  Social History Narrative  . Not on file    Relevant past medical, surgical, family and social history reviewed and  updated as indicated. Interim medical history since our last visit reviewed. Allergies and medications reviewed and updated.  Review of Systems  Per HPI unless specifically indicated above     Objective:    BP 116/82   Pulse 85   Temp 98.7 F (37.1 C) (Oral)   Wt (!) 314 lb 12.8 oz (142.8 kg)   SpO2 96%   BMI 47.17 kg/m   Wt Readings from Last 3 Encounters:  10/10/17 (!) 314 lb 12.8 oz (142.8 kg)  04/10/17 (!) 304 lb 4 oz (138 kg)  10/02/16 (!) 316 lb 14.4 oz (143.7 kg)    Physical Exam  Constitutional: He is oriented to person, place, and time. He appears well-developed and well-nourished.  Appears ill  HENT:  Head: Atraumatic.  B/l TMs injected with mild middle ear effusion B/l sinuses ttp Oropharynx and nasal mucosa erythematous and edematous, thick drainage present  Eyes: Conjunctivae are normal. Pupils are equal, round, and reactive to light. No scleral icterus.  Neck: Normal range of motion. Neck supple.  Cardiovascular: Normal rate and normal heart sounds.  Pulmonary/Chest: Effort normal and breath sounds normal. No respiratory distress.  Musculoskeletal: Normal  range of motion.  Neurological: He is alert and oriented to person, place, and time.  Skin: Skin is warm and dry.  Psychiatric: He has a normal mood and affect. His behavior is normal.  Nursing note and vitals reviewed.  Results for orders placed or performed in visit on 10/10/17  Veritor Flu A/B Waived  Result Value Ref Range   Influenza A Positive (A) Negative   Influenza B Negative Negative      Assessment & Plan:   Problem List Items Addressed This Visit    None    Visit Diagnoses    Influenza A    -  Primary   + Rapid flu. Will tx with xofluza, tessalon, tussionex, OTC pain/fever reducers. Sedation precautions reviewed at length. Return precautions given   Relevant Medications   Baloxavir Marboxil 80 MG Dose (XOFLUZA) 40 (2) MG TBPK   Other Relevant Orders   Veritor Flu A/B Waived  (Completed)   Acute maxillary sinusitis, recurrence not specified       Will tx with augmentin, mucinex, sinus rinses, humidifier. F/u if worsening or no improvement   Relevant Medications   Baloxavir Marboxil 80 MG Dose (XOFLUZA) 40 (2) MG TBPK   amoxicillin-clavulanate (AUGMENTIN) 875-125 MG tablet   benzonatate (TESSALON) 200 MG capsule   chlorpheniramine-HYDROcodone (TUSSIONEX PENNKINETIC ER) 10-8 MG/5ML SUER       Follow up plan: Return if symptoms worsen or fail to improve.

## 2017-10-10 NOTE — Patient Instructions (Signed)
Follow up as needed

## 2018-06-10 ENCOUNTER — Encounter: Payer: Self-pay | Admitting: Family Medicine

## 2018-06-10 ENCOUNTER — Ambulatory Visit (INDEPENDENT_AMBULATORY_CARE_PROVIDER_SITE_OTHER): Payer: BLUE CROSS/BLUE SHIELD | Admitting: Family Medicine

## 2018-06-10 ENCOUNTER — Other Ambulatory Visit: Payer: Self-pay | Admitting: Family Medicine

## 2018-06-10 VITALS — BP 138/84 | HR 99 | Ht 70.0 in | Wt 336.0 lb

## 2018-06-10 DIAGNOSIS — Z0000 Encounter for general adult medical examination without abnormal findings: Secondary | ICD-10-CM | POA: Diagnosis not present

## 2018-06-10 DIAGNOSIS — Z23 Encounter for immunization: Secondary | ICD-10-CM | POA: Diagnosis not present

## 2018-06-10 DIAGNOSIS — R358 Other polyuria: Secondary | ICD-10-CM | POA: Diagnosis not present

## 2018-06-10 DIAGNOSIS — R3589 Other polyuria: Secondary | ICD-10-CM

## 2018-06-10 LAB — BAYER DCA HB A1C WAIVED: HB A1C (BAYER DCA - WAIVED): 5.4 % (ref <7.0)

## 2018-06-10 LAB — UA/M W/RFLX CULTURE, ROUTINE
Bilirubin, UA: NEGATIVE
GLUCOSE, UA: NEGATIVE
Ketones, UA: NEGATIVE
LEUKOCYTES UA: NEGATIVE
Nitrite, UA: NEGATIVE
PROTEIN UA: NEGATIVE
RBC, UA: NEGATIVE
Specific Gravity, UA: 1.02 (ref 1.005–1.030)
UUROB: 0.2 mg/dL (ref 0.2–1.0)
pH, UA: 7 (ref 5.0–7.5)

## 2018-06-10 MED ORDER — NALTREXONE-BUPROPION HCL ER 8-90 MG PO TB12: ORAL_TABLET | ORAL | 6 refills | Status: DC

## 2018-06-10 NOTE — Patient Instructions (Signed)

## 2018-06-10 NOTE — Assessment & Plan Note (Signed)
Did well with contrave before. Will restart it and recheck 4-6 weeks. Call with any concerns.

## 2018-06-10 NOTE — Progress Notes (Signed)
BP 138/84   Pulse 99   Ht 5\' 10"  (1.778 m)   Wt (!) 336 lb (152.4 kg)   SpO2 99%   BMI 48.21 kg/m    Subjective:    Patient ID: Edward Price, male    DOB: 1986/04/08, 32 y.o.   MRN: [id number]  HPI: Edward Price is a 32 y.o. male presenting on 06/10/2018 for comprehensive medical examination. Current medical complaints include:  WEIGHT GAIN Duration: chronic Previous attempts at weight loss: yes, weight watchers, exercise, belviq, contrave Complications of obesity: None Peak weight: current, 336lbs Weight loss goal: 80lbs Weight loss to date: none Requesting obesity pharmacotherapy: yes Current weight loss supplements/medications: no Previous weight loss supplements/meds: yes  He currently lives with: family Interim Problems from his last visit: yes- broke his toe  Depression Screen done today and results listed below:  Depression screen [facility] 2/9 06/10/2018 10/02/2016 04/18/2016  Decreased Interest 0 0 0  Down, Depressed, Hopeless 0 0 0  PHQ - 2 Score 0 0 0  Altered sleeping 0 0 0  Tired, decreased energy 3 0 0  Change in appetite 3 1 2   Feeling bad or failure about yourself  0 0 0  Trouble concentrating 0 0 0  Moving slowly or fidgety/restless 1 0 0  Suicidal thoughts 0 0 0  PHQ-9 Score 7 1 2   Difficult doing work/chores Somewhat difficult - -    Past Medical History:  Past Medical History:  Diagnosis Date  . Morbid obesity (HCC)   . Nephrolithiasis     Surgical History:  History reviewed. No pertinent surgical history.  Medications:  No current outpatient medications on file prior to visit.   No current facility-administered medications on file prior to visit.     Allergies:  No Known Allergies  Social History:  Social History   Socioeconomic History  . Marital status: Married    Spouse name: Not on file  . Number of children: Not on file  . Years of education: Not on file  . Highest education level: Not on file  Occupational History  . Not  on file  Social Needs  . Financial resource strain: Not on file  . Food insecurity:    Worry: Not on file    Inability: Not on file  . Transportation needs:    Medical: Not on file    Non-medical: Not on file  Tobacco Use  . Smoking status: Former Smoker    Types: Cigarettes  . Smokeless tobacco: Current User    Types: Chew  Substance and Sexual Activity  . Alcohol use: No  . Drug use: No  . Sexual activity: Yes  Lifestyle  . Physical activity:    Days per week: Not on file    Minutes per session: Not on file  . Stress: Not on file  Relationships  . Social connections:    Talks on phone: Not on file    Gets together: Not on file    Attends religious service: Not on file    Active member of club or organization: Not on file    Attends meetings of clubs or organizations: Not on file    Relationship status: Not on file  . Intimate partner violence:    Fear of current or ex partner: Not on file    Emotionally abused: Not on file    Physically abused: Not on file    Forced sexual activity: Not on file  Other Topics Concern  . Not  on file  Social History Narrative  . Not on file   Social History   Tobacco Use  Smoking Status Former Smoker  . Types: Cigarettes  Smokeless Tobacco Current User  . Types: Chew   Social History   Substance and Sexual Activity  Alcohol Use No    Family History:  Family History  Problem Relation Age of Onset  . Diabetes Mother   . Hypertension Mother   . Diabetes Father   . Hypertension Father   . Heart disease Maternal Grandmother   . Hypertension Maternal Grandmother   . Cancer Maternal Grandfather        liver  . Heart disease Maternal Grandfather   . Hypertension Maternal Grandfather   . Hypertension Paternal Grandmother   . Hypertension Paternal Grandfather   . Stroke Neg Hx   . COPD Neg Hx     Past medical history, surgical history, medications, allergies, family history and social history reviewed with patient today  and changes made to appropriate areas of the chart.   Review of Systems  Constitutional: Positive for malaise/fatigue. Negative for chills, diaphoresis, fever and weight loss.  HENT: Negative.   Eyes: Negative.   Respiratory: Positive for wheezing (this summer). Negative for cough, hemoptysis, sputum production and shortness of breath.   Cardiovascular: Negative.   Gastrointestinal: Positive for heartburn. Negative for abdominal pain, blood in stool, constipation, diarrhea, melena, nausea and vomiting.  Genitourinary: Negative.   Musculoskeletal: Negative.   Skin: Negative.   Neurological: Negative.   Endo/Heme/Allergies: Positive for environmental allergies and polydipsia. Bruises/bleeds easily.  Psychiatric/Behavioral: Negative.     All other ROS negative except what is listed above and in the HPI.      Objective:    BP 138/84   Pulse 99   Ht 5\' 10"  (1.778 m)   Wt (!) 336 lb (152.4 kg)   SpO2 99%   BMI 48.21 kg/m   Wt Readings from Last 3 Encounters:  06/10/18 (!) 336 lb (152.4 kg)  10/10/17 (!) 314 lb 12.8 oz (142.8 kg)  04/10/17 (!) 304 lb 4 oz (138 kg)    Physical Exam  Constitutional: He is oriented to person, place, and time. He appears well-developed and well-nourished. No distress.  HENT:  Head: Normocephalic and atraumatic.  Right Ear: Hearing, tympanic membrane, external ear and ear canal normal.  Left Ear: Hearing, tympanic membrane, external ear and ear canal normal.  Nose: Nose normal.  Mouth/Throat: Uvula is midline, oropharynx is clear and moist and mucous membranes are normal. No oropharyngeal exudate.  Eyes: Pupils are equal, round, and reactive to light. Conjunctivae, EOM and lids are normal. Right eye exhibits no discharge. Left eye exhibits no discharge. No scleral icterus.  Neck: Normal range of motion. Neck supple. No JVD present. No tracheal deviation present. No thyromegaly present.  Cardiovascular: Normal rate, regular rhythm, normal heart sounds  and intact distal pulses. Exam reveals no gallop and no friction rub.  No murmur heard. Pulmonary/Chest: Effort normal and breath sounds normal. No stridor. No respiratory distress. He has no wheezes. He has no rales. He exhibits no tenderness.  Abdominal: Soft. Bowel sounds are normal. He exhibits no distension and no mass. There is no tenderness. There is no rebound and no guarding. No hernia.  Genitourinary:  Genitourinary Comments: Genital exam deferred at patient request   Musculoskeletal: Normal range of motion. He exhibits no edema, tenderness or deformity.  Lymphadenopathy:    He has no cervical adenopathy.  Neurological: He is alert  and oriented to person, place, and time. He displays normal reflexes. No cranial nerve deficit or sensory deficit. He exhibits normal muscle tone. Coordination normal.  Skin: Skin is warm, dry and intact. Capillary refill takes less than 2 seconds. No rash noted. He is not diaphoretic. No erythema. No pallor.  Psychiatric: He has a normal mood and affect. His speech is normal and behavior is normal. Judgment and thought content normal. Cognition and memory are normal.  Nursing note and vitals reviewed.   Results for orders placed or performed in visit on 10/10/17  Veritor Flu A/B Waived  Result Value Ref Range   Influenza A Positive (A) Negative   Influenza B Negative Negative      Assessment & Plan:   Problem List Items Addressed This Visit      Other   Morbid obesity (HCC)    Did well with contrave before. Will restart it and recheck 4-6 weeks. Call with any concerns.       Relevant Medications   Naltrexone-buPROPion HCl ER (CONTRAVE) 8-90 MG TB12    Other Visit Diagnoses    Routine general medical examination at a health care facility    -  Primary   Vaccines updated. Screening labs checked today. Continue diet and exercise. Call with any concerns.    Relevant Orders   Bayer DCA Hb A1c Waived   Comprehensive metabolic panel   CBC with  Differential/Platelet   Lipid Panel w/o Chol/HDL Ratio   TSH   UA/M w/rflx Culture, Routine   Needs flu shot       Flu shot given today.   Relevant Orders   Flu Vaccine QUAD 6+ mos PF IM (Fluarix Quad PF) (Completed)   Polyuria       Will check A1c. Await results.    Relevant Orders   Bayer DCA Hb A1c Waived      LABORATORY TESTING:  Health maintenance labs ordered today as discussed above.   IMMUNIZATIONS:   - Tdap: Tetanus vaccination status reviewed: last tetanus booster within 10 years. - Influenza: Administered today - Pneumovax: Not applicable  PATIENT COUNSELING:    Sexuality: Discussed sexually transmitted diseases, partner selection, use of condoms, avoidance of unintended pregnancy  and contraceptive alternatives.   Advised to avoid cigarette smoking.  I discussed with the patient that most people either abstain from alcohol or drink within safe limits (<=14/week and <=4 drinks/occasion for males, <=7/weeks and <= 3 drinks/occasion for females) and that the risk for alcohol disorders and other health effects rises proportionally with the number of drinks per week and how often a drinker exceeds daily limits.  Discussed cessation/primary prevention of drug use and availability of treatment for abuse.   Diet: Encouraged to adjust caloric intake to maintain  or achieve ideal body weight, to reduce intake of dietary saturated fat and total fat, to limit sodium intake by avoiding high sodium foods and not adding table salt, and to maintain adequate dietary potassium and calcium preferably from fresh fruits, vegetables, and low-fat dairy products.    stressed the importance of regular exercise  Injury prevention: Discussed safety belts, safety helmets, smoke detector, smoking near bedding or upholstery.   Dental health: Discussed importance of regular tooth brushing, flossing, and dental visits.   Follow up plan: NEXT PREVENTATIVE PHYSICAL DUE IN 1 YEAR. Return 4-6  weeks, for follow up contrave.

## 2018-06-11 LAB — COMPREHENSIVE METABOLIC PANEL WITH GFR
ALT: 33 [IU]/L (ref 0–44)
AST: 16 [IU]/L (ref 0–40)
Albumin/Globulin Ratio: 1.6 (ref 1.2–2.2)
Albumin: 4 g/dL (ref 3.5–5.5)
Alkaline Phosphatase: 103 [IU]/L (ref 39–117)
BUN/Creatinine Ratio: 13 (ref 9–20)
BUN: 12 mg/dL (ref 6–20)
Bilirubin Total: <0.2 mg/dL (ref 0.0–1.2)
CO2: 22 mmol/L (ref 20–29)
Calcium: 9.4 mg/dL (ref 8.7–10.2)
Chloride: 105 mmol/L (ref 96–106)
Creatinine, Ser: 0.91 mg/dL (ref 0.76–1.27)
GFR calc Af Amer: 127 mL/min/{1.73_m2}
GFR calc non Af Amer: 110 mL/min/{1.73_m2}
Globulin, Total: 2.5 g/dL (ref 1.5–4.5)
Glucose: 87 mg/dL (ref 65–99)
Potassium: 4.4 mmol/L (ref 3.5–5.2)
Sodium: 144 mmol/L (ref 134–144)
Total Protein: 6.5 g/dL (ref 6.0–8.5)

## 2018-06-11 LAB — CBC WITH DIFFERENTIAL/PLATELET
Basophils Absolute: 0 10*3/uL (ref 0.0–0.2)
Basos: 0 %
EOS (ABSOLUTE): 0.2 10*3/uL (ref 0.0–0.4)
Eos: 2 %
Hematocrit: 43.2 % (ref 37.5–51.0)
Hemoglobin: 14.8 g/dL (ref 13.0–17.7)
Immature Grans (Abs): 0 10*3/uL (ref 0.0–0.1)
Immature Granulocytes: 0 %
Lymphocytes Absolute: 3.7 10*3/uL — ABNORMAL HIGH (ref 0.7–3.1)
Lymphs: 40 %
MCH: 30.1 pg (ref 26.6–33.0)
MCHC: 34.3 g/dL (ref 31.5–35.7)
MCV: 88 fL (ref 79–97)
Monocytes Absolute: 0.7 10*3/uL (ref 0.1–0.9)
Monocytes: 7 %
Neutrophils Absolute: 4.7 10*3/uL (ref 1.4–7.0)
Neutrophils: 51 %
Platelets: 303 10*3/uL (ref 150–450)
RBC: 4.92 x10E6/uL (ref 4.14–5.80)
RDW: 13.2 % (ref 12.3–15.4)
WBC: 9.3 10*3/uL (ref 3.4–10.8)

## 2018-06-11 LAB — LIPID PANEL W/O CHOL/HDL RATIO
Cholesterol, Total: 180 mg/dL (ref 100–199)
HDL: 45 mg/dL
LDL Calculated: 104 mg/dL — ABNORMAL HIGH (ref 0–99)
Triglycerides: 157 mg/dL — ABNORMAL HIGH (ref 0–149)
VLDL Cholesterol Cal: 31 mg/dL (ref 5–40)

## 2018-06-11 LAB — TSH: TSH: 2.45 u[IU]/mL (ref 0.450–4.500)

## 2018-07-18 ENCOUNTER — Ambulatory Visit: Payer: BLUE CROSS/BLUE SHIELD | Admitting: Family Medicine

## 2018-12-25 ENCOUNTER — Telehealth: Payer: Self-pay | Admitting: Family Medicine

## 2018-12-25 NOTE — Telephone Encounter (Signed)
Copied from CRM [id number]. Topic: General - Other >> Dec 25, 2018 10:57 AM Jaquita Rector A wrote: Reason for CRM: Patient called to say that he was informed by [facility] that he need a prior authorization in order to get his medication Naltrexone-buPROPion HCl ER (CONTRAVE) 8-90 MG TB12 filled again. Please advise

## 2018-12-25 NOTE — Telephone Encounter (Signed)
Prior authorization for Contrave was initiated via [email or web address]. Key: [id number]  Did have to call patient to get updated insurance information. Richrd Humbles [id number] ; Member ID: [id number]  Pt will make sure to have card next time he is in so it can be placed in system.

## 2018-12-25 NOTE — Telephone Encounter (Signed)
Please start PA

## 2019-02-02 ENCOUNTER — Ambulatory Visit (INDEPENDENT_AMBULATORY_CARE_PROVIDER_SITE_OTHER): Payer: BLUE CROSS/BLUE SHIELD | Admitting: Family Medicine

## 2019-02-02 ENCOUNTER — Other Ambulatory Visit: Payer: Self-pay

## 2019-02-02 ENCOUNTER — Encounter: Payer: Self-pay | Admitting: Family Medicine

## 2019-02-02 MED ORDER — BUPROPION HCL ER (SR) 150 MG PO TB12: 300.0000 mg | ORAL_TABLET | Freq: Two times a day (BID) | ORAL | 6 refills | Status: DC

## 2019-02-02 MED ORDER — BUPROPION HCL ER (SR) 150 MG PO TB12: ORAL_TABLET | ORAL | 3 refills | Status: DC

## 2019-02-02 MED ORDER — NALTREXONE HCL 50 MG PO TABS: 50.0000 mg | ORAL_TABLET | Freq: Every day | ORAL | 6 refills | Status: DC

## 2019-02-02 NOTE — Progress Notes (Signed)
BP 125/77   Pulse 83   Temp 98.2 F (36.8 C) (Oral)   SpO2 96%    Subjective:    Patient ID: Edward JollyBryan G Christmas, male    DOB: June 09, 1986, 33 y.o.   MRN: [id number]  HPI: Edward Price is a 33 y.o. male  Chief Complaint  Patient presents with  . Medication Problem    pt states he wants to discuss different medication options other than Contrave   OBESITY- stopped paying for the contrave about 6 months ago. Has gained weight and not done well. Wants to go back on it, but they won't pay for it. Has tried weight watchers, diet and exercise, several other diets, nothing else worked. Tried belviq without benefit.  Duration: chronic Previous attempts at weight loss: yes Complications of obesity: none Peak weight: current, 336 Weight loss goal: 80lbs Weight loss to date: none Requesting obesity pharmacotherapy: yes Current weight loss supplements/medications: no Previous weight loss supplements/meds: yes  Relevant past medical, surgical, family and social history reviewed and updated as indicated. Interim medical history since our last visit reviewed. Allergies and medications reviewed and updated.  Review of Systems  Constitutional: Negative.   Respiratory: Negative.   Cardiovascular: Negative.   Gastrointestinal: Negative.   Neurological: Negative.   Psychiatric/Behavioral: Negative.     Per HPI unless specifically indicated above     Objective:    BP 125/77   Pulse 83   Temp 98.2 F (36.8 C) (Oral)   SpO2 96%   Wt Readings from Last 3 Encounters:  06/10/18 (!) 336 lb (152.4 kg)  10/10/17 (!) 314 lb 12.8 oz (142.8 kg)  04/10/17 (!) 304 lb 4 oz (138 kg)    Physical Exam Vitals signs and nursing note reviewed.  Constitutional:      General: He is not in acute distress.    Appearance: Normal appearance. He is obese. He is not ill-appearing, toxic-appearing or diaphoretic.  HENT:     Head: Normocephalic and atraumatic.     Right Ear: External ear normal.     Left  Ear: External ear normal.     Nose: Nose normal.     Mouth/Throat:     Mouth: Mucous membranes are moist.     Pharynx: Oropharynx is clear.  Eyes:     General: No scleral icterus.       Right eye: No discharge.        Left eye: No discharge.     Extraocular Movements: Extraocular movements intact.     Conjunctiva/sclera: Conjunctivae normal.     Pupils: Pupils are equal, round, and reactive to light.  Neck:     Musculoskeletal: Normal range of motion and neck supple.  Cardiovascular:     Rate and Rhythm: Normal rate and regular rhythm.     Pulses: Normal pulses.     Heart sounds: Normal heart sounds. No murmur. No friction rub. No gallop.   Pulmonary:     Effort: Pulmonary effort is normal. No respiratory distress.     Breath sounds: Normal breath sounds. No stridor. No wheezing, rhonchi or rales.  Chest:     Chest wall: No tenderness.  Musculoskeletal: Normal range of motion.  Skin:    General: Skin is warm and dry.     Capillary Refill: Capillary refill takes less than 2 seconds.     Coloration: Skin is not jaundiced or pale.     Findings: No bruising, erythema, lesion or rash.  Neurological:  General: No focal deficit present.     Mental Status: He is alert and oriented to person, place, and time. Mental status is at baseline.  Psychiatric:        Mood and Affect: Mood normal.        Behavior: Behavior normal.        Thought Content: Thought content normal.        Judgment: Judgment normal.     Results for orders placed or performed in visit on 06/10/18  Bayer DCA Hb A1c Waived  Result Value Ref Range   HB A1C (BAYER DCA - WAIVED) 5.4 <7.0 %  Comprehensive metabolic panel  Result Value Ref Range   Glucose 87 65 - 99 mg/dL   BUN 12 6 - 20 mg/dL   Creatinine, Ser 5.40 0.76 - 1.27 mg/dL   GFR calc non Af Amer 110 >59 mL/min/1.73   GFR calc Af Amer 127 >59 mL/min/1.73   BUN/Creatinine Ratio 13 9 - 20   Sodium 144 134 - 144 mmol/L   Potassium 4.4 3.5 - 5.2 mmol/L    Chloride 105 96 - 106 mmol/L   CO2 22 20 - 29 mmol/L   Calcium 9.4 8.7 - 10.2 mg/dL   Total Protein 6.5 6.0 - 8.5 g/dL   Albumin 4.0 3.5 - 5.5 g/dL   Globulin, Total 2.5 1.5 - 4.5 g/dL   Albumin/Globulin Ratio 1.6 1.2 - 2.2   Bilirubin Total <0.2 0.0 - 1.2 mg/dL   Alkaline Phosphatase 103 39 - 117 IU/L   AST 16 0 - 40 IU/L   ALT 33 0 - 44 IU/L  CBC with Differential/Platelet  Result Value Ref Range   WBC 9.3 3.4 - 10.8 x10E3/uL   RBC 4.92 4.14 - 5.80 x10E6/uL   Hemoglobin 14.8 13.0 - 17.7 g/dL   Hematocrit 98.1 19.1 - 51.0 %   MCV 88 79 - 97 fL   MCH 30.1 26.6 - 33.0 pg   MCHC 34.3 31.5 - 35.7 g/dL   RDW 47.8 29.5 - 62.1 %   Platelets 303 150 - 450 x10E3/uL   Neutrophils 51 Not Estab. %   Lymphs 40 Not Estab. %   Monocytes 7 Not Estab. %   Eos 2 Not Estab. %   Basos 0 Not Estab. %   Neutrophils Absolute 4.7 1.4 - 7.0 x10E3/uL   Lymphocytes Absolute 3.7 (H) 0.7 - 3.1 x10E3/uL   Monocytes Absolute 0.7 0.1 - 0.9 x10E3/uL   EOS (ABSOLUTE) 0.2 0.0 - 0.4 x10E3/uL   Basophils Absolute 0.0 0.0 - 0.2 x10E3/uL   Immature Granulocytes 0 Not Estab. %   Immature Grans (Abs) 0.0 0.0 - 0.1 x10E3/uL  Lipid Panel w/o Chol/HDL Ratio  Result Value Ref Range   Cholesterol, Total 180 100 - 199 mg/dL   Triglycerides 308 (H) 0 - 149 mg/dL   HDL 45 >65 mg/dL   VLDL Cholesterol Cal 31 5 - 40 mg/dL   LDL Calculated 784 (H) 0 - 99 mg/dL  TSH  Result Value Ref Range   TSH 2.450 0.450 - 4.500 uIU/mL  UA/M w/rflx Culture, Routine  Result Value Ref Range   Specific Gravity, UA 1.020 1.005 - 1.030   pH, UA 7.0 5.0 - 7.5   Color, UA Yellow Yellow   Appearance Ur Turbid (A) Clear   Leukocytes, UA Negative Negative   Protein, UA Negative Negative/Trace   Glucose, UA Negative Negative   Ketones, UA Negative Negative   RBC, UA Negative Negative  Bilirubin, UA Negative Negative   Urobilinogen, Ur 0.2 0.2 - 1.0 mg/dL   Nitrite, UA Negative Negative      Assessment & Plan:   Problem List  Items Addressed This Visit      Other   Morbid obesity (HCC) - Primary    Does GREAT on contrave. Not paid for. Will attempt to split the medicine and recheck 1 month. Call with any concerns. Wellbutrin and naltrexone sent to his pharmacy. Will also get him in with bariatrics. Call with any concerns.       Relevant Orders   Amb Referral to Bariatric Surgery       Follow up plan: Return in about 4 weeks (around 03/02/2019).

## 2019-02-02 NOTE — Assessment & Plan Note (Addendum)
Does GREAT on contrave. Not paid for. Will attempt to split the medicine and recheck 1 month. Call with any concerns. Wellbutrin and naltrexone sent to his pharmacy. Will also get him in with bariatrics. Call with any concerns.

## 2019-02-02 NOTE — Patient Instructions (Addendum)
Bariatric surgery.

## 2019-02-04 ENCOUNTER — Telehealth: Payer: Self-pay

## 2019-02-04 NOTE — Telephone Encounter (Signed)
He cannot cut the wellbutrin in 1/2- have him try taking them separately and with food and if it still happens, we'll see what we can do

## 2019-02-04 NOTE — Telephone Encounter (Signed)
Called and spoke with patient. He wanted to know if he can cut the "pill" in half. He stated that it made him very nauseous. I asked him which pill he meant and he said he didn't know because he took them together. It was his first dose. Please advise.   Copied from CRM [id number]. Topic: Quick Communication - Rx Refill/Question >> Feb 04, 2019  1:50 PM Randol Kern wrote: Medication: buPROPion [facility] SR) 150 MG 12 hr tablet [[id number]] + naltrexone (DEPADE) 50 MG tablet [[id number]]  [phone] >> Feb 04, 2019  1:53 PM Randol Kern wrote: Pt has questions, requesting call back

## 2019-02-04 NOTE — Telephone Encounter (Signed)
Message relayed to patient. Verbalized understanding and denied questions.   

## 2019-03-13 ENCOUNTER — Ambulatory Visit (INDEPENDENT_AMBULATORY_CARE_PROVIDER_SITE_OTHER): Payer: BC Managed Care – PPO | Admitting: Family Medicine

## 2019-03-13 ENCOUNTER — Encounter: Payer: Self-pay | Admitting: Family Medicine

## 2019-03-13 ENCOUNTER — Other Ambulatory Visit: Payer: Self-pay

## 2019-03-13 VITALS — BP 122/82 | HR 80 | Temp 98.3°F | Wt 354.8 lb

## 2019-03-13 DIAGNOSIS — N5089 Other specified disorders of the male genital organs: Secondary | ICD-10-CM | POA: Diagnosis not present

## 2019-03-13 MED ORDER — CIPROFLOXACIN HCL 500 MG PO TABS: 500.0000 mg | ORAL_TABLET | Freq: Two times a day (BID) | ORAL | 0 refills | Status: DC

## 2019-03-13 NOTE — Progress Notes (Signed)
BP 122/82   Pulse 80   Temp 98.3 F (36.8 C) (Oral)   Wt (!) 354 lb 12.8 oz (160.9 kg)   SpO2 97%   BMI 50.91 kg/m    Subjective:    Patient ID: Edward Price, Edward Price    DOB: 1985-11-11, 33 y.o.   MRN: [id number]  HPI: Edward Price is a 33 y.o. Edward Price  Chief Complaint  Patient presents with  . Obesity   OBESITY Duration: chronic Previous attempts at weight loss: yes- Has tried weight watchers, diet and exercise, several other diets, nothing else worked. Tried belviq without benefit.  Complications of obesity: none Peak weight: 365 Weight loss goal: 80lbs Weight loss to date: 11 lbs Requesting obesity pharmacotherapy: yes Current weight loss supplements/medications: yes Previous weight loss supplements/meds: yes  Has been having some swelling in his L testicle for about the last few days. It's mildly painful, more of a pulling pain. No redness, no burning when he pees, he's concerned about it. No other concerns or complaints at this time.   Relevant past medical, surgical, family and social history reviewed and updated as indicated. Interim medical history since our last visit reviewed. Allergies and medications reviewed and updated.  Review of Systems  Constitutional: Negative.   Respiratory: Negative.   Cardiovascular: Negative.   Genitourinary: Positive for scrotal swelling and testicular pain. Negative for decreased urine volume, difficulty urinating, discharge, dysuria, enuresis, flank pain, frequency, genital sores, hematuria, penile pain, penile swelling and urgency.  Musculoskeletal: Negative.   Neurological: Negative.   Psychiatric/Behavioral: Negative.     Per HPI unless specifically indicated above     Objective:    BP 122/82   Pulse 80   Temp 98.3 F (36.8 C) (Oral)   Wt (!) 354 lb 12.8 oz (160.9 kg)   SpO2 97%   BMI 50.91 kg/m   Wt Readings from Last 3 Encounters:  03/13/19 (!) 354 lb 12.8 oz (160.9 kg)  06/10/18 (!) 336 lb (152.4 kg)  10/10/17  (!) 314 lb 12.8 oz (142.8 kg)    Physical Exam Vitals signs and nursing note reviewed.  Constitutional:      General: He is not in acute distress.    Appearance: Normal appearance. He is obese. He is not ill-appearing, toxic-appearing or diaphoretic.  HENT:     Head: Normocephalic and atraumatic.     Right Ear: External ear normal.     Left Ear: External ear normal.     Nose: Nose normal.     Mouth/Throat:     Mouth: Mucous membranes are moist.     Pharynx: Oropharynx is clear.  Eyes:     General: No scleral icterus.       Right eye: No discharge.        Left eye: No discharge.     Extraocular Movements: Extraocular movements intact.     Conjunctiva/sclera: Conjunctivae normal.     Pupils: Pupils are equal, round, and reactive to light.  Neck:     Musculoskeletal: Normal range of motion and neck supple.  Cardiovascular:     Rate and Rhythm: Normal rate and regular rhythm.     Pulses: Normal pulses.     Heart sounds: Normal heart sounds. No murmur. No friction rub. No gallop.   Pulmonary:     Effort: Pulmonary effort is normal. No respiratory distress.     Breath sounds: Normal breath sounds. No stridor. No wheezing, rhonchi or rales.  Chest:     Chest  wall: No tenderness.  Abdominal:     Hernia: There is no hernia in the left inguinal area or right inguinal area.  Genitourinary:    Penis: Circumcised.      Scrotum/Testes:        Right: Mass, tenderness, swelling, testicular hydrocele or varicocele not present. Right testis is descended. Cremasteric reflex is present.         Left: Swelling and testicular hydrocele present. Mass, tenderness or varicocele not present. Left testis is descended. Cremasteric reflex is present.      Epididymis:     Right: Normal.     Left: Tenderness present.    Musculoskeletal: Normal range of motion.  Skin:    General: Skin is warm and dry.     Capillary Refill: Capillary refill takes less than 2 seconds.     Coloration: Skin is not  jaundiced or pale.     Findings: No bruising, erythema, lesion or rash.  Neurological:     General: No focal deficit present.     Mental Status: He is alert and oriented to person, place, and time. Mental status is at baseline.  Psychiatric:        Mood and Affect: Mood normal.        Behavior: Behavior normal.        Thought Content: Thought content normal.        Judgment: Judgment normal.     Results for orders placed or performed in visit on 06/10/18  Bayer DCA Hb A1c Waived  Result Value Ref Range   HB A1C (BAYER DCA - WAIVED) 5.4 <7.0 %  Comprehensive metabolic panel  Result Value Ref Range   Glucose 87 65 - 99 mg/dL   BUN 12 6 - 20 mg/dL   Creatinine, Ser 1.610.91 0.76 - 1.27 mg/dL   GFR calc non Af Amer 110 >59 mL/min/1.73   GFR calc Af Amer 127 >59 mL/min/1.73   BUN/Creatinine Ratio 13 9 - 20   Sodium 144 134 - 144 mmol/L   Potassium 4.4 3.5 - 5.2 mmol/L   Chloride 105 96 - 106 mmol/L   CO2 22 20 - 29 mmol/L   Calcium 9.4 8.7 - 10.2 mg/dL   Total Protein 6.5 6.0 - 8.5 g/dL   Albumin 4.0 3.5 - 5.5 g/dL   Globulin, Total 2.5 1.5 - 4.5 g/dL   Albumin/Globulin Ratio 1.6 1.2 - 2.2   Bilirubin Total <0.2 0.0 - 1.2 mg/dL   Alkaline Phosphatase 103 39 - 117 IU/L   AST 16 0 - 40 IU/L   ALT 33 0 - 44 IU/L  CBC with Differential/Platelet  Result Value Ref Range   WBC 9.3 3.4 - 10.8 x10E3/uL   RBC 4.92 4.14 - 5.80 x10E6/uL   Hemoglobin 14.8 13.0 - 17.7 g/dL   Hematocrit 09.643.2 04.537.5 - 51.0 %   MCV 88 79 - 97 fL   MCH 30.1 26.6 - 33.0 pg   MCHC 34.3 31.5 - 35.7 g/dL   RDW 40.913.2 81.112.3 - 91.415.4 %   Platelets 303 150 - 450 x10E3/uL   Neutrophils 51 Not Estab. %   Lymphs 40 Not Estab. %   Monocytes 7 Not Estab. %   Eos 2 Not Estab. %   Basos 0 Not Estab. %   Neutrophils Absolute 4.7 1.4 - 7.0 x10E3/uL   Lymphocytes Absolute 3.7 (H) 0.7 - 3.1 x10E3/uL   Monocytes Absolute 0.7 0.1 - 0.9 x10E3/uL   EOS (ABSOLUTE) 0.2 0.0 - 0.4 x10E3/uL  Basophils Absolute 0.0 0.0 - 0.2 x10E3/uL    Immature Granulocytes 0 Not Estab. %   Immature Grans (Abs) 0.0 0.0 - 0.1 x10E3/uL  Lipid Panel w/o Chol/HDL Ratio  Result Value Ref Range   Cholesterol, Total 180 100 - 199 mg/dL   Triglycerides 157 (H) 0 - 149 mg/dL   HDL 45 >39 mg/dL   VLDL Cholesterol Cal 31 5 - 40 mg/dL   LDL Calculated 104 (H) 0 - 99 mg/dL  TSH  Result Value Ref Range   TSH 2.450 0.450 - 4.500 uIU/mL  UA/M w/rflx Culture, Routine   Specimen: Blood   BLD  Result Value Ref Range   Specific Gravity, UA 1.020 1.005 - 1.030   pH, UA 7.0 5.0 - 7.5   Color, UA Yellow Yellow   Appearance Ur Turbid (A) Clear   Leukocytes, UA Negative Negative   Protein, UA Negative Negative/Trace   Glucose, UA Negative Negative   Ketones, UA Negative Negative   RBC, UA Negative Negative   Bilirubin, UA Negative Negative   Urobilinogen, Ur 0.2 0.2 - 1.0 mg/dL   Nitrite, UA Negative Negative      Assessment & Plan:   Problem List Items Addressed This Visit      Other   Morbid obesity ([place])    Doing well, down 11lbs- will continue to increase his wellbutrin and call if not doing well. Continue medication. Continue diet and exercise. Call with any concerns.        Other Visit Diagnoses    Testicular swelling    -  Primary   Concern for epiditimytis vs. hydrocele- will treat with cipro and arrange for US scrotum, very low suspicion of testicular torsion. Warning signs to go to ER.    Relevant Orders   US SCROTUM W/DOPPLER       Follow up plan: Return in about 3 months (around 06/13/2019) for follow up weight.

## 2019-03-13 NOTE — Patient Instructions (Signed)
Epididymitis  Epididymitis is swelling (inflammation) or infection of the epididymis. The epididymis is a cord-like structure that is located along the top and back part of the testicle. It collects and stores sperm from the testicle. This condition can also cause pain and swelling of the testicle and scrotum. Symptoms usually start suddenly (acute epididymitis). Sometimes epididymitis starts gradually and lasts for a while (chronic epididymitis). This type may be harder to treat. What are the causes? In men ages 31-40, this condition is usually caused by a bacterial infection or a sexually transmitted disease (STD), such as:  Gonorrhea.  Chlamydia. In men 31 and older who do not have anal sex, this condition is usually caused by bacteria from a blockage or from abnormalities in the urinary system. These can result from:  Having a tube placed into the bladder (urinary catheter).  Having an enlarged or inflamed prostate gland.  Having recently had urinary tract surgery.  Having a problem with a backward flow of urine (retrograde). In men who have a condition that weakens the body's defense system (immune system), such as HIV, this condition can be caused by:  Other bacteria, including tuberculosis and syphilis.  Viruses.  Fungi. Sometimes this condition occurs without infection. This may happen because of trauma or repetitive activities such as sports. What increases the risk? You are more likely to develop this condition if you have:  Unprotected sex with more than one partner.  Anal sex.  Recently had surgery.  A urinary catheter.  Urinary problems.  A suppressed immune system. What are the signs or symptoms? This condition usually begins suddenly with chills, fever, and pain behind the scrotum and in the testicle. Other symptoms include:  Swelling of the scrotum, testicle, or both.  Pain when ejaculating or urinating.  Pain in the back or abdomen.  Nausea.   Itching and discharge from the penis.  A frequent need to pass urine.  Redness, increased warmth, and tenderness of the scrotum. How is this diagnosed? Your health care provider can diagnose this condition based on your symptoms and medical history. Your health care provider will also do a physical exam to ask about your symptoms and check your scrotum and testicle for swelling, pain, and redness. You may also have other tests, including:  Examination of discharge from the penis.  Urine tests for infections, such as STDs.  Ultrasound test for blood flow and inflammation. Your health care provider may test you for other STDs, including HIV. How is this treated? Treatment for this condition depends on the cause. If your condition is caused by a bacterial infection, oral antibiotic medicine may be prescribed. If the bacterial infection has spread to your blood, you may need to receive IV antibiotics. For both bacterial and nonbacterial epididymitis, you may be treated with:  Rest.  Elevation of the scrotum.  Pain medicines.  Anti-inflammatory medicines. Surgery may be needed to treat:  Bacterial epididymitis that causes pus to build up in the scrotum (abscess).  Chronic epididymitis that has not responded to other treatments. Follow these instructions at home: Medicines  Take over-the-counter and prescription medicines only as told by your health care provider.  If you were prescribed an antibiotic medicine, take it as told by your health care provider. Do not stop taking the antibiotic even if your condition improves. Sexual activity  If your epididymitis was caused by an STD, avoid sexual activity until your treatment is complete.  Inform your sexual partner or partners if you test positive for  an STD. They may need to be treated. Do not engage in sexual activity with your partner or partners until their treatment is completed. Managing pain and swelling   If directed,  elevate your scrotum and apply ice. ? Put ice in a plastic bag. ? Place a small towel or pillow between your legs. ? Rest your scrotum on the pillow or towel. ? Place another towel between your skin and the plastic bag. ? Leave the ice on for 20 minutes, 2-3 times a day.  Try taking a sitz bath to help with discomfort. This is a warm water bath that is taken while you are sitting down. The water should only come up to your hips and should cover your buttocks. Do this 3-4 times per day or as told by your health care provider.  Keep your scrotum elevated and supported while resting. Ask your health care provider if you should wear a scrotal support, such as a jockstrap. Wear it as told by your health care provider. General instructions  Return to your normal activities as told by your health care provider. Ask your health care provider what activities are safe for you.  Drink enough fluid to keep your urine pale yellow.  Keep all follow-up visits as told by your health care provider. This is important. Contact a health care provider if:  You have a fever.  Your pain medicine is not helping.  Your pain is getting worse.  Your symptoms do not improve within 3 days. Summary  Epididymitis is swelling (inflammation) or infection of the epididymis. This condition can also cause pain and swelling of the testicle and scrotum.  Treatment for this condition depends on the cause. If your condition is caused by a bacterial infection, oral antibiotic medicine may be prescribed.  Inform your sexual partner or partners if you test positive for an STD. They may need to be treated. Do not engage in sexual activity with your partner or partners until their treatment is completed.  Contact a health care provider if your symptoms do not improve within 3 days. This information is not intended to replace advice given to you by your health care provider. Make sure you discuss any questions you have with  your health care provider. Document Released: 08/17/2000 Document Revised: 06/23/2018 Document Reviewed: 06/24/2018 Elsevier Patient Education  2020 [employer]  Hydrocele, Adult A hydrocele is a collection of fluid in the loose pouch of skin that holds the testicles (scrotum). This may happen because:  The amount of fluid produced in the scrotum is not absorbed by the rest of the body.  Fluid from the abdomen fills the scrotum. Normally, the testicles develop in the abdomen then move (drop) into to the scrotum before birth. The tube that the testicles travel through usually closes after the testicles drop. If the tube does not close, fluid from the abdomen can fill the scrotum. This is less common in adults. What are the causes? The cause of a hydrocele in adults is usually not known. However, it may be caused by:  An injury to the scrotum.  An infection (epididymitis).  Decreased blood flow to the scrotum.  Twisting of a testicle (testicular torsion).  A birth defect.  A tumor or cancer of the testicle. What are the signs or symptoms? A hydrocele feels like a water-filled balloon. It may also feel heavy. Other symptoms include:  Swelling of the scrotum. The swelling may decrease when you lie down. You may also notice more swelling  at night than in the morning.  Swelling of the groin.  Mild discomfort in the scrotum.  Pain. This can develop if the hydrocele was caused by infection or twisting. The larger the hydrocele, the more likely you are to have pain. How is this diagnosed? This condition may be diagnosed based on:  Physical exam.  Medical history. You may also have other tests, including:  Imaging tests, such as ultrasound.  Blood or urine tests. How is this treated? Most hydroceles go away on their own. If you have no discomfort or pain, your health care provider may suggest close monitoring of your condition (called watch and wait or watchful waiting) until  the condition goes away or symptoms develop. If treatment is needed, it may include:  Treating an underlying condition. This may include using an antibiotic medicine to treat an infection.  Surgery to stop fluid from collecting in the scrotum.  Surgery to drain the fluid. Options include: ? Needle aspiration. A needle is used to drain fluid. However, the fluid buildup will come back quickly. ? Hydrocelectomy. For this procedure, an incision is made in the scrotum to remove the fluid sac. Follow these instructions at home:  Watch the hydrocele for any changes.  Take over-the-counter and prescription medicines only as told by your health care provider.  If you were prescribed an antibiotic medicine, use it as told by your health care provider. Do not stop taking the antibiotic even if you start to feel better.  Keep all follow-up visits as told by your health care provider. This is important. Contact a health care provider if:  You notice any changes in the hydrocele.  The swelling in your scrotum or groin gets worse.  The hydrocele becomes red, firm, painful, or tender to the touch.  You have a fever. Get help right away if you:  Develop a lot of pain, or your pain becomes worse. Summary  A hydrocele is a collection of fluid in the loose pouch of skin that holds the testicles (scrotum).  Hydroceles can cause swelling, discomfort, and sometimes pain.  In adults, the cause of a hydrocele usually is not known. However, it is sometimes caused by an infection or a rotation and twisting of the scrotum.  Treatment is usually not needed. Hydroceles often go away on their own. If a hydrocele causes pain, treatment may be given to ease the pain. This information is not intended to replace advice given to you by your health care provider. Make sure you discuss any questions you have with your health care provider. Document Released: 02/07/2010 Document Revised: 08/31/2017 Document  Reviewed: 08/31/2017 Elsevier Patient Education  2020 [employer]  Scrotal Swelling Scrotal swelling refers to a condition in which the sac of skin that contains the testes (scrotum) is enlarged or swollen. Many things can cause the scrotum to enlarge or swell, including:  Fluid around the testicle (hydrocele).  A weakened area in the muscles around the groin (hernia).  An enlarged vein around the testicle (varicocele).  An injury.  An infection.  Certain medical treatments.  Certain medical conditions, such as congestive heart failure.  A recent genital surgery or procedure.  A twisting of the spermatic cord that cuts off blood supply (testicular torsion).  Testicular cancer. Scrotal swelling can happen along with scrotal pain. Follow these instructions at home:  Until the swelling goes away: ? Rest. The best position to rest in is to lie down. ? Limit activity.  Put ice on the scrotum: ?  Put ice in a plastic bag. ? Place a towel between your skin and the bag. ? Leave the ice on for 20 minutes, 2-3 times a day for 1-2 days.  Place a rolled towel under your testicles for support.  Wear loose-fitting clothing or an athletic support cup for comfort.  Take over-the-counter and prescription medicines only as told by your health care provider.  Perform a monthly self-exam of the scrotum and penis. Feel for changes. Ask your health care provider how to perform a monthly self-exam if you are unsure. Contact a health care provider if:  You have a sudden pain that is persistent and does not improve.  You have a heavy feeling or notice fluid in the scrotum.  You have pain or burning while urinating.  You have blood in your urine or semen.  You feel a lump around the testicle.  You notice that one testicle is larger than the other. Keep in mind that a small difference in size is normal.  You have a persistent dull ache or pain in your groin or scrotum. Get help  right away if:  The pain does not go away.  The pain becomes severe.  You have a fever or chills.  You have pain or vomiting that cannot be controlled.  One or both sides of the scrotum are very red and swollen.  There is redness spreading upward from your scrotum to your abdomen or downward from your scrotum to your thighs. Summary  Scrotal swelling refers to a condition in which the sac of skin that contains the testes (scrotum) is enlarged.  Many things can cause the scrotum to swell, including hydrocele, a hernia, and a varicocele.  Limiting activity and icing the scrotum may help reduce swelling and pain.  Contact your health care provider if you develop scrotal pain that is sudden and persistent, or if you have pain while urinating. Do this also if you feel a lump around the testicle or notice blood in your urine or semen.  Get help right away for uncontrolled pain or vomiting, for very red and swollen scrotum, or for fever or chills. This information is not intended to replace advice given to you by your health care provider. Make sure you discuss any questions you have with your health care provider. Document Released: 09/22/2010 Document Revised: 08/02/2017 Document Reviewed: 11/05/2016 Elsevier Patient Education  2020 [employer].

## 2019-03-14 ENCOUNTER — Encounter: Payer: Self-pay | Admitting: Family Medicine

## 2019-03-14 NOTE — Assessment & Plan Note (Signed)
Doing well, down 11lbs- will continue to increase his wellbutrin and call if not doing well. Continue medication. Continue diet and exercise. Call with any concerns.

## 2019-03-23 ENCOUNTER — Telehealth: Payer: Self-pay | Admitting: Family Medicine

## 2019-03-23 NOTE — Telephone Encounter (Signed)
Pt would like FU with Dr Wynetta Emery, is sch for  Wed and she said that the med she put him on may improve  Pt and to call her either way and she can if improves maybe cancel appt Wed.  Pls call pt with FU is improved. # [phone]

## 2019-03-24 NOTE — Telephone Encounter (Signed)
LVM for pt to call back.

## 2019-03-25 ENCOUNTER — Other Ambulatory Visit: Payer: Self-pay

## 2019-03-25 ENCOUNTER — Ambulatory Visit
Admission: RE | Admit: 2019-03-25 | Discharge: 2019-03-25 | Disposition: A | Discharge status: Home / Self Care | Payer: BC Managed Care – PPO | Source: Ambulatory Visit | Attending: Family Medicine | Admitting: Family Medicine

## 2019-03-25 DIAGNOSIS — N5089 Other specified disorders of the male genital organs: Secondary | ICD-10-CM | POA: Diagnosis not present

## 2019-03-25 DIAGNOSIS — N433 Hydrocele, unspecified: Secondary | ICD-10-CM | POA: Diagnosis not present

## 2019-03-25 NOTE — Telephone Encounter (Signed)
Pt would like to know if an appt is needed to fu on ultrasound results.

## 2019-03-26 ENCOUNTER — Telehealth: Payer: Self-pay | Admitting: Family Medicine

## 2019-03-26 DIAGNOSIS — N433 Hydrocele, unspecified: Secondary | ICD-10-CM

## 2019-03-26 NOTE — Telephone Encounter (Signed)
Please let him know that lump in his scrotum is a hydrocele. I've gotten him a referral to see the urologist so they can discuss options. Thanks!

## 2019-03-26 NOTE — Telephone Encounter (Signed)
I don't know what this means? See other telephone encounter from today

## 2019-03-26 NOTE — Telephone Encounter (Signed)
Referral changed to urgent.

## 2019-03-26 NOTE — Telephone Encounter (Signed)
Message relayed to patient. Verbalized understanding. Patient would like to know what he should do in the meantime. 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

## 2019-03-26 NOTE — Telephone Encounter (Signed)
Was able to get ahold of Edward Price finally and got pt scheduled for 9 a.m. Monday.

## 2019-03-26 NOTE — Telephone Encounter (Signed)
Spoke w/ Dr. 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 Called patient back and explained situation. Pt verbalized understanding. Pt would like to be written out of work in order to get some medication to help give him some relief.   Have attempted to reach [facility] x 5 to see how quickly we will be able to get patient in to be seen.

## 2019-03-30 ENCOUNTER — Other Ambulatory Visit: Payer: Self-pay

## 2019-03-30 ENCOUNTER — Encounter: Payer: Self-pay | Admitting: Urology

## 2019-03-30 ENCOUNTER — Ambulatory Visit (INDEPENDENT_AMBULATORY_CARE_PROVIDER_SITE_OTHER): Payer: BC Managed Care – PPO | Admitting: Urology

## 2019-03-30 VITALS — BP 145/97 | HR 99 | Ht 70.0 in | Wt 350.0 lb

## 2019-03-30 DIAGNOSIS — R300 Dysuria: Secondary | ICD-10-CM | POA: Diagnosis not present

## 2019-03-30 DIAGNOSIS — N200 Calculus of kidney: Secondary | ICD-10-CM

## 2019-03-30 DIAGNOSIS — N432 Other hydrocele: Secondary | ICD-10-CM | POA: Diagnosis not present

## 2019-03-30 LAB — URINALYSIS, COMPLETE
Bilirubin, UA: NEGATIVE
Glucose, UA: NEGATIVE
Ketones, UA: NEGATIVE
Leukocytes,UA: NEGATIVE
Nitrite, UA: NEGATIVE
RBC, UA: NEGATIVE
Specific Gravity, UA: 1.025 (ref 1.005–1.030)
Urobilinogen, Ur: 0.2 mg/dL (ref 0.2–1.0)
pH, UA: 7 (ref 5.0–7.5)

## 2019-03-30 LAB — MICROSCOPIC EXAMINATION
Bacteria, UA: NONE SEEN
Epithelial Cells (non renal): NONE SEEN /hpf (ref 0–10)
RBC, Urine: NONE SEEN /hpf (ref 0–2)

## 2019-03-30 NOTE — Progress Notes (Signed)
03/30/19 9:20 AM   Edward JollyBryan G Price October 28, 1985 [id number]  Referring provider: Dorcas Price, Edward P, DO [address] [place],  [place] [id number]  CC: Left hydrocele  HPI: I saw Mr. Edward Price in urology clinic today in consultation from Dr. Laural Price for left hydrocele.  He is a 33 year old male with past medical history notable for morbid obesity and nephrolithiasis who presents with 1 to 2 weeks of left scrotal swelling that has been moderately painful.  Scrotal ultrasound on 03/25/2019 showed a small left hydrocele, but otherwise no pathology.  He reports the pain is worse with driving.  He also reports dull bilateral back pain.  He is concerned this could potentially be from a stone.  He admits this feels differently than his prior stone events.  He has passed 6 stones spontaneously in the past, and has never required surgery for this.  There are no aggravating or alleviating factors.  Urinalysis today benign appearing with 0-5 WBCs, 0 RBCs, no bacteria, no yeast, nitrite negative.   PMH: Past Medical History:  Diagnosis Date  . Morbid obesity (HCC)   . Nephrolithiasis     Surgical History: No past surgical history on file.   Allergies: No Known Allergies  Family History: Family History  Problem Relation Age of Onset  . Diabetes Mother   . Hypertension Mother   . Diabetes Father   . Hypertension Father   . Heart disease Maternal Grandmother   . Hypertension Maternal Grandmother   . Cancer Maternal Grandfather        liver  . Heart disease Maternal Grandfather   . Hypertension Maternal Grandfather   . Hypertension Paternal Grandmother   . Hypertension Paternal Grandfather   . Stroke Neg Hx   . COPD Neg Hx     Social History:  reports that he has quit smoking. His smoking use included cigarettes. His smokeless tobacco use includes chew. He reports that he does not drink alcohol or use drugs.  ROS: Please see flowsheet from today's date for complete review of systems.  Physical  Exam: BP (!) 145/97 (BP Location: Left Arm, Patient Position: Sitting, Cuff Size: Large)   Pulse 99   Ht 5\' 10"  (1.778 m)   Wt (!) 350 lb (158.8 kg)   BMI 50.22 kg/m    Constitutional:  Alert and oriented, No acute distress. Cardiovascular: No clubbing, cyanosis, or edema. Respiratory: Normal respiratory effort, no increased work of breathing. GI: Abdomen is soft, nontender, nondistended, no abdominal masses GU: No CVA tenderness, phallus without lesions, widely patent meatus.  Small left hydrocele, nontender.  Testicles descended bilaterally without masses. Lymph: No cervical or inguinal lymphadenopathy. Skin: No rashes, bruises or suspicious lesions. Neurologic: Grossly intact, no focal deficits, moving all 4 extremities. Psychiatric: Normal mood and affect.  Laboratory Data: Reviewed  Urinalysis today 0-5 WBCs, 0 RBCs, no bacteria, nitrite negative  Pertinent Imaging: I have personally reviewed the scrotal ultrasound dated 7/22, normal testicles, small left hydrocele  Assessment & Plan:   In summary, the patient is a 33 year old male with morbid obesity and mild left scrotal pain secondary to a small left hydrocele.  He also has bilateral low back pain that is not classic for renal colic.  Additionally, his urinalysis is benign appearing today.  We discussed options at length for his left hydrocele including observation, hydrocelectomy, or aspiration.  I strongly recommended conservative approach with anti-inflammatories, snug fitting underwear, and icing as needed, as his hydrocele is quite small and non-tender on exam.  He is in agreement and would like to avoid surgery at this time which is very reasonable.  We discussed that sometimes these will resolve spontaneously, however can continue to enlarge and get more painful at which place we could re-discuss surgery.  We discussed general stone prevention strategies including adequate hydration with goal of producing 2.5 L of urine  daily, increasing citric acid intake, increasing calcium intake during high oxalate meals, minimizing animal protein, and decreasing salt intake. Information about dietary recommendations given today.   Follow-up as needed Conservative approach for small left hydrocele including snug fitting underwear, anti-inflammatories, and icing as needed Consider CT in the future if worsening flank pain worrisome for renal colic   Edward Co, MD  [facility] [address], [place], [place] [address] [phone]

## 2019-03-30 NOTE — Patient Instructions (Signed)
Hydrocele, Adult A hydrocele is a collection of fluid in the loose pouch of skin that holds the testicles (scrotum). This may happen because:  The amount of fluid produced in the scrotum is not absorbed by the rest of the body.  Fluid from the abdomen fills the scrotum. Normally, the testicles develop in the abdomen then move (drop) into to the scrotum before birth. The tube that the testicles travel through usually closes after the testicles drop. If the tube does not close, fluid from the abdomen can fill the scrotum. This is less common in adults. What are the causes? The cause of a hydrocele in adults is usually not known. However, it may be caused by:  An injury to the scrotum.  An infection (epididymitis).  Decreased blood flow to the scrotum.  Twisting of a testicle (testicular torsion).  A birth defect.  A tumor or cancer of the testicle. What are the signs or symptoms? A hydrocele feels like a water-filled balloon. It may also feel heavy. Other symptoms include:  Swelling of the scrotum. The swelling may decrease when you lie down. You may also notice more swelling at night than in the morning.  Swelling of the groin.  Mild discomfort in the scrotum.  Pain. This can develop if the hydrocele was caused by infection or twisting. The larger the hydrocele, the more likely you are to have pain. How is this diagnosed? This condition may be diagnosed based on:  Physical exam.  Medical history. You may also have other tests, including:  Imaging tests, such as ultrasound.  Blood or urine tests. How is this treated? Most hydroceles go away on their own. If you have no discomfort or pain, your health care provider may suggest close monitoring of your condition (called watch and wait or watchful waiting) until the condition goes away or symptoms develop. If treatment is needed, it may include:  Treating an underlying condition. This may include using an antibiotic medicine to  treat an infection.  Surgery to stop fluid from collecting in the scrotum.  Surgery to drain the fluid. Options include: ? Needle aspiration. A needle is used to drain fluid. However, the fluid buildup will come back quickly. ? Hydrocelectomy. For this procedure, an incision is made in the scrotum to remove the fluid sac. Follow these instructions at home:  Watch the hydrocele for any changes.  Take over-the-counter and prescription medicines only as told by your health care provider.  If you were prescribed an antibiotic medicine, use it as told by your health care provider. Do not stop taking the antibiotic even if you start to feel better.  Keep all follow-up visits as told by your health care provider. This is important. Contact a health care provider if:  You notice any changes in the hydrocele.  The swelling in your scrotum or groin gets worse.  The hydrocele becomes red, firm, painful, or tender to the touch.  You have a fever. Get help right away if you:  Develop a lot of pain, or your pain becomes worse. Summary  A hydrocele is a collection of fluid in the loose pouch of skin that holds the testicles (scrotum).  Hydroceles can cause swelling, discomfort, and sometimes pain.  In adults, the cause of a hydrocele usually is not known. However, it is sometimes caused by an infection or a rotation and twisting of the scrotum.  Treatment is usually not needed. Hydroceles often go away on their own. If a hydrocele causes pain, treatment   may be given to ease the pain. This information is not intended to replace advice given to you by your health care provider. Make sure you discuss any questions you have with your health care provider. Document Released: 02/07/2010 Document Revised: 08/31/2017 Document Reviewed: 08/31/2017 Elsevier Patient Education  2020 Elsevier Inc.   Hydrocelectomy, Adult  A hydrocelectomy is a surgical procedure to remove a collection of fluid  (hydrocele) from the pouch that holds the testicles (scrotum). You may need to have a hydrocelectomy if a hydrocele is making your scrotum swell painfully. Tell a health care provider about:  Any allergies you have.  All medicines you are taking, including vitamins, herbs, eye drops, creams, and over-the-counter medicines.  Any problems you or family members have had with anesthetic medicines.  Any blood disorders you have.  Any surgeries you have had.  Any medical conditions you have. What are the risks? Generally this is a safe procedure. However, problems may occur, including:  Bleeding into the scrotum (scrotal hematoma).  Damage to the testicle or the tube that carries sperm out of the testicle (vas deferens).  Infection.  Allergic reactions to medicines. What happens before the procedure? Staying hydrated Follow instructions from your health care provider about hydration, which may include:  Up to 2 hours before the procedure - you may continue to drink clear liquids, such as water, clear fruit juice, black coffee, and plain tea. Eating and drinking restrictions Follow instructions from your health care provider about eating and drinking, which may include:  8 hours before the procedure - stop eating heavy meals or foods such as meat, fried foods, or fatty foods.  6 hours before the procedure - stop eating light meals or foods, such as toast or cereal.  6 hours before the procedure - stop drinking milk or drinks that contain milk.  2 hours before the procedure - stop drinking clear liquids. Medicines  Ask your health care provider about: ? Changing or stopping your regular medicines. This is especially important if you are taking diabetes medicines or blood thinners. ? Taking medicines such as aspirin and ibuprofen. These medicines can thin your blood. Do not take these medicines before your procedure if your health care provider instructs you not to.  You may be  given antibiotic medicine to help prevent infection. General instructions  Plan to have someone take you home after the procedure. What happens during the procedure?  To reduce your risk of infection: ? Your health care team will wash or sanitize their hands. ? A germ-killing solution (antiseptic) will be used to wash your scrotum and the area around it. Hair may be removed from this area.  An IV tube will be inserted into one of your veins.  You will be given one or more of the following: ? A medicine to make you relax (sedative). ? A medicine to make you fall asleep (general anesthetic).  A small incision will be made through the skin of your scrotum.  Your testicle and the hydrocele will be located, and the hydrocele sac will be opened with an incision.  The fluid will be drained from the hydrocele.  The hydrocele will be closed with absorbable stitches (sutures) to prevent fluid from building up again.  If you have a large hydrocele, a thin rubber drain may be placed to allow fluid to drain after the procedure.  The incision in your scrotum will be closed with absorbable sutures.  A bandage (dressing) will be placed over the incision.   The procedure may vary among health care providers and hospitals. What happens after the procedure?  Your blood pressure, heart rate, breathing rate, and blood oxygen level will be monitored until the medicines you were given have worn off.  You will be given pain medicine as needed.  Do not drive for 24 hours if you were given a sedative.  You may have to wear an athletic support strap to hold the dressing in place and support your scrotum. This information is not intended to replace advice given to you by your health care provider. Make sure you discuss any questions you have with your health care provider. Document Released: 05/11/2015 Document Revised: 08/02/2017 Document Reviewed: 05/19/2016 Elsevier Patient Education  2020 Elsevier  Inc.   Kidney Stones  Kidney stones (urolithiasis) are solid, rock-like deposits that form inside of the organs that make urine (kidneys). A kidney stone may form in a kidney and move into the bladder, where it can cause intense pain and block the flow of urine. Kidney stones are created when high levels of certain minerals are found in the urine. They are usually passed through urination, but in some cases, medical treatment may be needed to remove them. What are the causes? Kidney stones may be caused by:  A condition in which certain glands produce too much parathyroid hormone (primary hyperparathyroidism), which causes too much calcium buildup in the blood.  Buildup of uric acid crystals in the bladder (hyperuricosuria). Uric acid is a chemical that the body produces when you eat certain foods. It usually exits the body in the urine.  Narrowing (stricture) of one or both of the tubes that drain urine from the kidneys to the bladder (ureters).  A kidney blockage that is present at birth (congenital obstruction).  Past surgery on the kidney or the ureters, such as gastric bypass surgery. What increases the risk? The following factors make you more likely to develop kidney stones:  Having had a kidney stone in the past.  Having a family history of kidney stones.  Not drinking enough water.  Eating a diet that is high in protein, salt (sodium), or sugar.  Being overweight or obese. What are the signs or symptoms? Symptoms of a kidney stone may include:  Nausea.  Vomiting.  Blood in the urine (hematuria).  Pain in the side of the abdomen, right below the ribs (flank pain). Pain usually spreads (radiates) to the groin.  Needing to urinate frequently or urgently. How is this diagnosed? This condition may be diagnosed based on:  Your medical history.  A physical exam.  Blood tests.  Urine tests.  CT scan.  Abdominal X-ray.  A procedure to examine the inside of the  bladder (cystoscopy). How is this treated? Treatment for kidney stones depends on the size, location, and makeup of the stones. Treatment may involve:  Analyzing your urine before and after you pass the stone through urination.  Being monitored at the hospital until you pass the stone through urination.  Increasing your fluid intake and decreasing the amount of calcium and protein in your diet.  A procedure to break up kidney stones in the bladder using: ? A focused beam of light (laser therapy). ? Shock waves (extracorporeal shock wave lithotripsy).  Surgery to remove kidney stones. This may be needed if you have severe pain or have stones that block your urinary tract. Follow these instructions at home: Eating and drinking  Drink enough fluid to keep your urine clear or pale yellow. This  will help you to pass the kidney stone.  If directed, change your diet. This may include: ? Limiting how much sodium you eat. ? Eating more fruits and vegetables. ? Limiting how much meat, poultry, fish, and eggs you eat.  Follow instructions from your health care provider about eating or drinking restrictions. General instructions  Collect urine samples as told by your health care provider. You may need to collect a urine sample: ? 24 hours after you pass the stone. ? 8-12 weeks after passing the kidney stone, and every 6-12 months after that.  Strain your urine every time you urinate, for as long as directed. Use the strainer that your health care provider recommends.  Do not throw out the kidney stone after passing it. Keep the stone so it can be tested by your health care provider. Testing the makeup of your kidney stone may help prevent you from getting kidney stones in the future.  Take over-the-counter and prescription medicines only as told by your health care provider.  Keep all follow-up visits as told by your health care provider. This is important. You may need follow-up X-rays or  ultrasounds to make sure that your stone has passed. How is this prevented? To prevent another kidney stone:  Drink enough fluid to keep your urine clear or pale yellow. This is the best way to prevent kidney stones.  Eat a healthy diet and follow recommendations from your health care provider about foods to avoid. You may be instructed to eat a low-protein diet. Recommendations vary depending on the type of kidney stone that you have.  Maintain a healthy weight. Contact a health care provider if:  You have pain that gets worse or does not get better with medicine. Get help right away if:  You have a fever or chills.  You develop severe pain.  You develop new abdominal pain.  You faint.  You are unable to urinate. This information is not intended to replace advice given to you by your health care provider. Make sure you discuss any questions you have with your health care provider. Document Released: 08/20/2005 Document Revised: 04/01/2018 Document Reviewed: 02/03/2016 Elsevier Patient Education  2020 Elsevier Inc.   Dietary Guidelines to Help Prevent Kidney Stones Kidney stones are deposits of minerals and salts that form inside your kidneys. Your risk of developing kidney stones may be greater depending on your diet, your lifestyle, the medicines you take, and whether you have certain medical conditions. Most people can reduce their chances of developing kidney stones by following the instructions below. Depending on your overall health and the type of kidney stones you tend to develop, your dietitian may give you more specific instructions. What are tips for following this plan? Reading food labels  Choose foods with "no salt added" or "low-salt" labels. Limit your sodium intake to less than 1500 mg per day.  Choose foods with calcium for each meal and snack. Try to eat about 300 mg of calcium at each meal. Foods that contain 200-500 mg of calcium per serving include: ? 8 oz  (237 ml) of milk, fortified nondairy milk, and fortified fruit juice. ? 8 oz (237 ml) of kefir, yogurt, and soy yogurt. ? 4 oz (118 ml) of tofu. ? 1 oz of cheese. ? 1 cup (300 g) of dried figs. ? 1 cup (91 g) of cooked broccoli. ? 1-3 oz can of sardines or mackerel.  Most people need 1000 to 1500 mg of calcium each day. Talk to  your dietitian about how much calcium is recommended for you. Shopping  Buy plenty of fresh fruits and vegetables. Most people do not need to avoid fruits and vegetables, even if they contain nutrients that may contribute to kidney stones.  When shopping for convenience foods, choose: ? Whole pieces of fruit. ? Premade salads with dressing on the side. ? Low-fat fruit and yogurt smoothies.  Avoid buying frozen meals or prepared deli foods.  Look for foods with live cultures, such as yogurt and kefir. Cooking  Do not add salt to food when cooking. Place a salt shaker on the table and allow each person to add his or her own salt to taste.  Use vegetable protein, such as beans, textured vegetable protein (TVP), or tofu instead of meat in pasta, casseroles, and soups. Meal planning   Eat less salt, if told by your dietitian. To do this: ? Avoid eating processed or premade food. ? Avoid eating fast food.  Eat less animal protein, including cheese, meat, poultry, or fish, if told by your dietitian. To do this: ? Limit the number of times you have meat, poultry, fish, or cheese each week. Eat a diet free of meat at least 2 days a week. ? Eat only one serving each day of meat, poultry, fish, or seafood. ? When you prepare animal protein, cut pieces into small portion sizes. For most meat and fish, one serving is about the size of one deck of cards.  Eat at least 5 servings of fresh fruits and vegetables each day. To do this: ? Keep fruits and vegetables on hand for snacks. ? Eat 1 piece of fruit or a handful of berries with breakfast. ? Have a salad and fruit  at lunch. ? Have two kinds of vegetables at dinner.  Limit foods that are high in a substance called oxalate. These include: ? Spinach. ? Rhubarb. ? Beets. ? Potato chips and french fries. ? Nuts.  If you regularly take a diuretic medicine, make sure to eat at least 1-2 fruits or vegetables high in potassium each day. These include: ? Avocado. ? Banana. ? Orange, prune, carrot, or tomato juice. ? Baked potato. ? Cabbage. ? Beans and split peas. General instructions   Drink enough fluid to keep your urine clear or pale yellow. This is the most important thing you can do.  Talk to your health care provider and dietitian about taking daily supplements. Depending on your health and the cause of your kidney stones, you may be advised: ? Not to take supplements with vitamin C. ? To take a calcium supplement. ? To take a daily probiotic supplement. ? To take other supplements such as magnesium, fish oil, or vitamin B6.  Take all medicines and supplements as told by your health care provider.  Limit alcohol intake to no more than 1 drink a day for nonpregnant women and 2 drinks a day for men. One drink equals 12 oz of beer, 5 oz of wine, or 1 oz of hard liquor.  Lose weight if told by your health care provider. Work with your dietitian to find strategies and an eating plan that works best for you. What foods are not recommended? Limit your intake of the following foods, or as told by your dietitian. Talk to your dietitian about specific foods you should avoid based on the type of kidney stones and your overall health. Grains Breads. Bagels. Rolls. Baked goods. Salted crackers. Cereal. Pasta. Vegetables Spinach. Rhubarb. Beets. Canned vegetables. Rosita FirePickles.  Olives. Meats and other protein foods Nuts. Nut butters. Large portions of meat, poultry, or fish. Salted or cured meats. Deli meats. Hot dogs. Sausages. Dairy Cheese. Beverages Regular soft drinks. Regular vegetable juice.  Seasonings and other foods Seasoning blends with salt. Salad dressings. Canned soups. Soy sauce. Ketchup. Barbecue sauce. Canned pasta sauce. Casseroles. Pizza. Lasagna. Frozen meals. Potato chips. [place] fries. Summary  You can reduce your risk of kidney stones by making changes to your diet.  The most important thing you can do is drink enough fluid. You should drink enough fluid to keep your urine clear or pale yellow.  Ask your health care provider or dietitian how much protein from animal sources you should eat each day, and also how much salt and calcium you should have each day. This information is not intended to replace advice given to you by your health care provider. Make sure you discuss any questions you have with your health care provider. Document Released: 12/15/2010 Document Revised: 12/10/2018 Document Reviewed: 07/31/2016 Elsevier Patient Education  2020 [employer].

## 2019-04-24 ENCOUNTER — Ambulatory Visit: Payer: BC Managed Care – PPO | Admitting: Family Medicine

## 2019-05-08 ENCOUNTER — Ambulatory Visit: Payer: BC Managed Care – PPO | Admitting: Family Medicine

## 2019-06-08 ENCOUNTER — Telehealth: Payer: Self-pay | Admitting: Family Medicine

## 2019-06-08 ENCOUNTER — Ambulatory Visit: Payer: BC Managed Care – PPO | Admitting: Family Medicine

## 2019-06-08 NOTE — Telephone Encounter (Signed)
I hope she feels better! We can do a virtual on Wednesday as long as he can get a weight if that works for him.

## 2019-06-08 NOTE — Telephone Encounter (Signed)
Copied from [place] [phone]. Topic: Quick Communication - Appointment Cancellation >> Jun 08, 2019  8:19 AM Pilar Grammes F wrote: Patient called to cancel appointment scheduled for 06/08/2019. Patient has not rescheduled their appointment. Patient's daughter fail this morning and broke her arm and he's taking her to the ER. Will call back to reschedule.  Route to department's P[facility] pool.

## 2019-06-08 NOTE — Telephone Encounter (Signed)
Called pt to let him know of provider's message, no answer, left vm

## 2019-09-16 ENCOUNTER — Telehealth (INDEPENDENT_AMBULATORY_CARE_PROVIDER_SITE_OTHER): Payer: BC Managed Care – PPO | Admitting: Family Medicine

## 2019-09-16 ENCOUNTER — Encounter: Payer: Self-pay | Admitting: Family Medicine

## 2019-09-16 ENCOUNTER — Other Ambulatory Visit: Payer: Self-pay

## 2019-09-16 ENCOUNTER — Ambulatory Visit: Payer: BC Managed Care – PPO | Admitting: Family Medicine

## 2019-09-16 VITALS — BP 159/96 | HR 87 | Temp 96.8°F | Ht 71.0 in | Wt 351.0 lb

## 2019-09-16 DIAGNOSIS — R030 Elevated blood-pressure reading, without diagnosis of hypertension: Secondary | ICD-10-CM

## 2019-09-16 DIAGNOSIS — F418 Other specified anxiety disorders: Secondary | ICD-10-CM

## 2019-09-16 MED ORDER — BUSPIRONE HCL 7.5 MG PO TABS: 7.5000 mg | ORAL_TABLET | Freq: Three times a day (TID) | ORAL | 3 refills | Status: DC

## 2019-09-16 NOTE — Progress Notes (Signed)
BP (!) 159/96   Pulse 87   Temp (!) 96.8 F (36 C)   Ht 5\' 11"  (1.803 m)   Wt (!) 351 lb (159.2 kg)   BMI 48.95 kg/m    Subjective:    Patient ID: Edward Price, male    DOB: Aug 02, 1986, 34 y.o.   MRN: [id number]  HPI: Edward Price is a 34 y.o. male  Chief Complaint  Patient presents with  . Hypertension   ELEVATED BLOOD PRESSURE Duration of elevated BP: about a month BP monitoring frequency: a few times a week BP range: 150s/90s Previous BP meds: no Recent stressors: yes Family history of hypertension: yes Recurrent headaches: no Visual changes: no Palpitations: no  Dyspnea: yes- with exertion Chest pain: no Lower extremity edema: no Dizzy/lightheaded: no Transient ischemic attacks: no  ANXIETY/STRESS Duration:exacerbated Anxious mood: yes  Excessive worrying: yes Irritability: yes  Sweating: no Nausea: yes Palpitations:yes Hyperventilation: yes Panic attacks: yes Agoraphobia: no  Obscessions/compulsions: yes Depressed mood: yes Depression screen [facility] 2/9 09/16/2019 06/10/2018 10/02/2016 04/18/2016  Decreased Interest 1 0 0 0  Down, Depressed, Hopeless 2 0 0 0  PHQ - 2 Score 3 0 0 0  Altered sleeping 0 0 0 0  Tired, decreased energy 1 3 0 0  Change in appetite 0 3 1 2   Feeling bad or failure about yourself  0 0 0 0  Trouble concentrating 3 0 0 0  Moving slowly or fidgety/restless 0 1 0 0  Suicidal thoughts 0 0 0 0  PHQ-9 Score 7 7 1 2   Difficult doing work/chores Not difficult at all Somewhat difficult - -   Anhedonia: no Weight changes: no Insomnia: no   Hypersomnia: no Fatigue/loss of energy: yes Feelings of worthlessness: yes Feelings of guilt: yes Impaired concentration/indecisiveness: yes Suicidal ideations: no  Crying spells: no Recent Stressors/Life Changes: yes   Relationship problems: no   Family stress: yes     Financial stress: yes    Job stress: yes    Recent death/loss: no   Relevant past medical, surgical, family and social  history reviewed and updated as indicated. Interim medical history since our last visit reviewed. Allergies and medications reviewed and updated.  Review of Systems  Constitutional: Negative.   Respiratory: Negative.   Cardiovascular: Negative.   Psychiatric/Behavioral: Negative for agitation, behavioral problems, confusion, decreased concentration, dysphoric mood, hallucinations, self-injury, sleep disturbance and suicidal ideas. The patient is nervous/anxious. The patient is not hyperactive.     Per HPI unless specifically indicated above     Objective:    BP (!) 159/96   Pulse 87   Temp (!) 96.8 F (36 C)   Ht 5\' 11"  (1.803 m)   Wt (!) 351 lb (159.2 kg)   BMI 48.95 kg/m   Wt Readings from Last 3 Encounters:  09/16/19 (!) 351 lb (159.2 kg)  03/30/19 (!) 350 lb (158.8 kg)  03/13/19 (!) 354 lb 12.8 oz (160.9 kg)    Physical Exam Vitals and nursing note reviewed.  Constitutional:      General: He is not in acute distress.    Appearance: Normal appearance. He is not ill-appearing, toxic-appearing or diaphoretic.  HENT:     Head: Normocephalic and atraumatic.     Right Ear: External ear normal.     Left Ear: External ear normal.     Nose: Nose normal.     Mouth/Throat:     Mouth: Mucous membranes are moist.     Pharynx: Oropharynx is  clear.  Eyes:     General: No scleral icterus.       Right eye: No discharge.        Left eye: No discharge.     Conjunctiva/sclera: Conjunctivae normal.     Pupils: Pupils are equal, round, and reactive to light.  Pulmonary:     Effort: Pulmonary effort is normal. No respiratory distress.     Comments: Speaking in full sentences Musculoskeletal:        General: Normal range of motion.     Cervical back: Normal range of motion.  Skin:    Coloration: Skin is not jaundiced or pale.     Findings: No bruising, erythema, lesion or rash.  Neurological:     Mental Status: He is alert and oriented to person, place, and time. Mental status is  at baseline.  Psychiatric:        Mood and Affect: Mood normal.        Behavior: Behavior normal.        Thought Content: Thought content normal.        Judgment: Judgment normal.     Results for orders placed or performed in visit on 03/30/19  Microscopic Examination   URINE  Result Value Ref Range   WBC, UA 0-5 0 - 5 /hpf   RBC None seen 0 - 2 /hpf   Epithelial Cells (non renal) None seen 0 - 10 /hpf   Casts Present (A) None seen /lpf   Cast Type Granular casts (A) N/A   Crystals Present (A) N/A   Crystal Type Amorphous Sediment N/A   Mucus, UA Present (A) Not Estab.   Bacteria, UA None seen None seen/Few  Urinalysis, Complete  Result Value Ref Range   Specific Gravity, UA 1.025 1.005 - 1.030   pH, UA 7.0 5.0 - 7.5   Color, UA Yellow Yellow   Appearance Ur Cloudy (A) Clear   Leukocytes,UA Negative Negative   Protein,UA Trace (A) Negative/Trace   Glucose, UA Negative Negative   Ketones, UA Negative Negative   RBC, UA Negative Negative   Bilirubin, UA Negative Negative   Urobilinogen, Ur 0.2 0.2 - 1.0 mg/dL   Nitrite, UA Negative Negative   Microscopic Examination See below:       Assessment & Plan:   Problem List Items Addressed This Visit    None    Visit Diagnoses    Elevated blood-pressure reading without diagnosis of hypertension    -  Primary   Will start DASH diet and recheck 1 month. Call with any concerns.    Situational anxiety       Will start buspar and monitor. Recheck 1 month. Call with any concerns.    Relevant Medications   busPIRone (BUSPAR) 7.5 MG tablet       Follow up plan: Return in about 4 weeks (around 10/14/2019).    . This visit was completed via FaceTime due to the restrictions of the COVID-19 pandemic. All issues as above were discussed and addressed. Physical exam was done as above through visual confirmation on FaceTime. If it was felt that the patient should be evaluated in the office, they were directed there. The patient  verbally consented to this visit. . Location of the patient: home . Location of the provider: home . Those involved with this call:  . Provider: Olevia Perches, DO . CMA: Tiffany Reel, CMA . Front Desk/Registration: Adela Ports  . Time spent on call: 25 minutes with patient face to  face via video conference. More than 50% of this time was spent in counseling and coordination of care. 40 minutes total spent in review of patient's record and preparation of their chart.

## 2019-09-16 NOTE — Patient Instructions (Signed)
DASH Eating Plan DASH stands for "Dietary Approaches to Stop Hypertension." The DASH eating plan is a healthy eating plan that has been shown to reduce high blood pressure (hypertension). It may also reduce your risk for type 2 diabetes, heart disease, and stroke. The DASH eating plan may also help with weight loss. What are tips for following this plan?  General guidelines  Avoid eating more than 2,300 mg (milligrams) of salt (sodium) a day. If you have hypertension, you may need to reduce your sodium intake to 1,500 mg a day.  Limit alcohol intake to no more than 1 drink a day for nonpregnant women and 2 drinks a day for men. One drink equals 12 oz of beer, 5 oz of wine, or 1 oz of hard liquor.  Work with your health care provider to maintain a healthy body weight or to lose weight. Ask what an ideal weight is for you.  Get at least 30 minutes of exercise that causes your heart to beat faster (aerobic exercise) most days of the week. Activities may include walking, swimming, or biking.  Work with your health care provider or diet and nutrition specialist (dietitian) to adjust your eating plan to your individual calorie needs. Reading food labels   Check food labels for the amount of sodium per serving. Choose foods with less than 5 percent of the Daily Value of sodium. Generally, foods with less than 300 mg of sodium per serving fit into this eating plan.  To find whole grains, look for the word "whole" as the first word in the ingredient list. Shopping  Buy products labeled as "low-sodium" or "no salt added."  Buy fresh foods. Avoid canned foods and premade or frozen meals. Cooking  Avoid adding salt when cooking. Use salt-free seasonings or herbs instead of table salt or sea salt. Check with your health care provider or pharmacist before using salt substitutes.  Do not fry foods. Cook foods using healthy methods such as baking, boiling, grilling, and broiling instead.  Cook with  heart-healthy oils, such as olive, canola, soybean, or sunflower oil. Meal planning  Eat a balanced diet that includes: ? 5 or more servings of fruits and vegetables each day. At each meal, try to fill half of your plate with fruits and vegetables. ? Up to 6-8 servings of whole grains each day. ? Less than 6 oz of lean meat, poultry, or fish each day. A 3-oz serving of meat is about the same size as a deck of cards. One egg equals 1 oz. ? 2 servings of low-fat dairy each day. ? A serving of nuts, seeds, or beans 5 times each week. ? Heart-healthy fats. Healthy fats called Omega-3 fatty acids are found in foods such as flaxseeds and coldwater fish, like sardines, salmon, and mackerel.  Limit how much you eat of the following: ? Canned or prepackaged foods. ? Food that is high in trans fat, such as fried foods. ? Food that is high in saturated fat, such as fatty meat. ? Sweets, desserts, sugary drinks, and other foods with added sugar. ? Full-fat dairy products.  Do not salt foods before eating.  Try to eat at least 2 vegetarian meals each week.  Eat more home-cooked food and less restaurant, buffet, and fast food.  When eating at a restaurant, ask that your food be prepared with less salt or no salt, if possible. What foods are recommended? The items listed may not be a complete list. Talk with your dietitian about   what dietary choices are best for you. Grains Whole-grain or whole-wheat bread. Whole-grain or whole-wheat pasta. Brown rice. Oatmeal. Quinoa. Bulgur. Whole-grain and low-sodium cereals. Pita bread. Low-fat, low-sodium crackers. Whole-wheat flour tortillas. Vegetables Fresh or frozen vegetables (raw, steamed, roasted, or grilled). Low-sodium or reduced-sodium tomato and vegetable juice. Low-sodium or reduced-sodium tomato sauce and tomato paste. Low-sodium or reduced-sodium canned vegetables. Fruits All fresh, dried, or frozen fruit. Canned fruit in natural juice (without  added sugar). Meat and other protein foods Skinless chicken or turkey. Ground chicken or turkey. Pork with fat trimmed off. Fish and seafood. Egg whites. Dried beans, peas, or lentils. Unsalted nuts, nut butters, and seeds. Unsalted canned beans. Lean cuts of beef with fat trimmed off. Low-sodium, lean deli meat. Dairy Low-fat (1%) or fat-free (skim) milk. Fat-free, low-fat, or reduced-fat cheeses. Nonfat, low-sodium ricotta or cottage cheese. Low-fat or nonfat yogurt. Low-fat, low-sodium cheese. Fats and oils Soft margarine without trans fats. Vegetable oil. Low-fat, reduced-fat, or light mayonnaise and salad dressings (reduced-sodium). Canola, safflower, olive, soybean, and sunflower oils. Avocado. Seasoning and other foods Herbs. Spices. Seasoning mixes without salt. Unsalted popcorn and pretzels. Fat-free sweets. What foods are not recommended? The items listed may not be a complete list. Talk with your dietitian about what dietary choices are best for you. Grains Baked goods made with fat, such as croissants, muffins, or some breads. Dry pasta or rice meal packs. Vegetables Creamed or fried vegetables. Vegetables in a cheese sauce. Regular canned vegetables (not low-sodium or reduced-sodium). Regular canned tomato sauce and paste (not low-sodium or reduced-sodium). Regular tomato and vegetable juice (not low-sodium or reduced-sodium). Pickles. Olives. Fruits Canned fruit in a light or heavy syrup. Fried fruit. Fruit in cream or butter sauce. Meat and other protein foods Fatty cuts of meat. Ribs. Fried meat. Bacon. Sausage. Bologna and other processed lunch meats. Salami. Fatback. Hotdogs. Bratwurst. Salted nuts and seeds. Canned beans with added salt. Canned or smoked fish. Whole eggs or egg yolks. Chicken or turkey with skin. Dairy Whole or 2% milk, cream, and half-and-half. Whole or full-fat cream cheese. Whole-fat or sweetened yogurt. Full-fat cheese. Nondairy creamers. Whipped toppings.  Processed cheese and cheese spreads. Fats and oils Butter. Stick margarine. Lard. Shortening. Ghee. Bacon fat. Tropical oils, such as coconut, palm kernel, or palm oil. Seasoning and other foods Salted popcorn and pretzels. Onion salt, garlic salt, seasoned salt, table salt, and sea salt. Worcestershire sauce. Tartar sauce. Barbecue sauce. Teriyaki sauce. Soy sauce, including reduced-sodium. Steak sauce. Canned and packaged gravies. Fish sauce. Oyster sauce. Cocktail sauce. Horseradish that you find on the shelf. Ketchup. Mustard. Meat flavorings and tenderizers. Bouillon cubes. Hot sauce and Tabasco sauce. Premade or packaged marinades. Premade or packaged taco seasonings. Relishes. Regular salad dressings. Where to find more information:  National Heart, Lung, and Blood Institute: www.nhlbi.nih.gov  American Heart Association: www.heart.org Summary  The DASH eating plan is a healthy eating plan that has been shown to reduce high blood pressure (hypertension). It may also reduce your risk for type 2 diabetes, heart disease, and stroke.  With the DASH eating plan, you should limit salt (sodium) intake to 2,300 mg a day. If you have hypertension, you may need to reduce your sodium intake to 1,500 mg a day.  When on the DASH eating plan, aim to eat more fresh fruits and vegetables, whole grains, lean proteins, low-fat dairy, and heart-healthy fats.  Work with your health care provider or diet and nutrition specialist (dietitian) to adjust your eating plan to your   individual calorie needs. This information is not intended to replace advice given to you by your health care provider. Make sure you discuss any questions you have with your health care provider. Document Revised: 08/02/2017 Document Reviewed: 08/13/2016 Elsevier Patient Education  2020 Elsevier Inc.  

## 2019-09-18 ENCOUNTER — Encounter: Payer: Self-pay | Admitting: Family Medicine

## 2019-09-18 NOTE — Progress Notes (Signed)
Called to schedule, no answer, no vm. Sent mychart message and sent letter.

## 2019-10-06 ENCOUNTER — Encounter: Payer: Self-pay | Admitting: Family Medicine

## 2019-10-06 ENCOUNTER — Other Ambulatory Visit: Payer: Self-pay

## 2019-10-06 ENCOUNTER — Ambulatory Visit (INDEPENDENT_AMBULATORY_CARE_PROVIDER_SITE_OTHER): Payer: BC Managed Care – PPO | Admitting: Family Medicine

## 2019-10-06 VITALS — BP 126/85 | HR 98 | Temp 98.4°F

## 2019-10-06 DIAGNOSIS — R030 Elevated blood-pressure reading, without diagnosis of hypertension: Secondary | ICD-10-CM | POA: Diagnosis not present

## 2019-10-06 DIAGNOSIS — Z23 Encounter for immunization: Secondary | ICD-10-CM

## 2019-10-06 DIAGNOSIS — F418 Other specified anxiety disorders: Secondary | ICD-10-CM | POA: Diagnosis not present

## 2019-10-06 LAB — MICROALBUMIN, URINE WAIVED
Creatinine, Urine Waived: 200 mg/dL (ref 10–300)
Microalb, Ur Waived: 10 mg/L (ref 0–19)
Microalb/Creat Ratio: <30 mg/g (ref <30)

## 2019-10-06 MED ORDER — SAXENDA 18 MG/3ML ~~LOC~~ SOPN: PEN_INJECTOR | SUBCUTANEOUS | 3 refills | Status: DC

## 2019-10-06 NOTE — Progress Notes (Signed)
BP 126/85   Pulse 98   Temp 98.4 F (36.9 C) (Oral)   SpO2 94%    Subjective:    Patient ID: Edward Price, male    DOB: 12-Mar-1986, 34 y.o.   MRN: [id number]  HPI: Edward Price is a 34 y.o. male  Chief Complaint  Patient presents with  . Hypertension   ELEVATED BLOOD PRESSURE Duration of elevated BP: transient BP monitoring frequency: rarely BP range:  Previous BP meds: no Recent stressors: yes Family history of hypertension: yes Recurrent headaches: no Visual changes: no Palpitations: no  Dyspnea: no Chest pain: no Lower extremity edema: no Dizzy/lightheaded: no Transient ischemic attacks: no  ANXIETY/STRESS Duration:better Anxious mood: yes  Excessive worrying: yes Irritability: no  Sweating: no Nausea: no Palpitations:no Hyperventilation: no Panic attacks: no Agoraphobia: no  Obscessions/compulsions: no Depressed mood: no Depression screen [facility] 2/9 09/16/2019 06/10/2018 10/02/2016 04/18/2016  Decreased Interest 1 0 0 0  Down, Depressed, Hopeless 2 0 0 0  PHQ - 2 Score 3 0 0 0  Altered sleeping 0 0 0 0  Tired, decreased energy 1 3 0 0  Change in appetite 0 3 1 2   Feeling bad or failure about yourself  0 0 0 0  Trouble concentrating 3 0 0 0  Moving slowly or fidgety/restless 0 1 0 0  Suicidal thoughts 0 0 0 0  PHQ-9 Score 7 7 1 2   Difficult doing work/chores Not difficult at all Somewhat difficult - -   Anhedonia: no Weight changes: no Insomnia: no   Hypersomnia: no Fatigue/loss of energy: no Feelings of worthlessness: no Feelings of guilt: no Impaired concentration/indecisiveness: no Suicidal ideations: no  Crying spells: no Recent Stressors/Life Changes: yes   Relationship problems: no   Family stress: no     Financial stress: yes    Job stress: yes    Recent death/loss: no  WEIGHT GAIN Duration: chronic Previous attempts at weight loss: yes, diet, exercise. Contrave, belviq, weight watchers Complications of obesity: None Peak weight:  current Weight loss goal: to be healthy Weight loss to date: none Requesting obesity pharmacotherapy: yes Current weight loss supplements/medications: no Previous weight loss supplements/meds: yes  Relevant past medical, surgical, family and social history reviewed and updated as indicated. Interim medical history since our last visit reviewed. Allergies and medications reviewed and updated.  Review of Systems  Constitutional: Negative.   Respiratory: Negative.   Cardiovascular: Negative.   Musculoskeletal: Negative.   Neurological: Negative.   Psychiatric/Behavioral: Negative.     Per HPI unless specifically indicated above     Objective:    BP 126/85   Pulse 98   Temp 98.4 F (36.9 C) (Oral)   SpO2 94%   Wt Readings from Last 3 Encounters:  09/16/19 (!) 351 lb (159.2 kg)  03/30/19 (!) 350 lb (158.8 kg)  03/13/19 (!) 354 lb 12.8 oz (160.9 kg)    Physical Exam Vitals and nursing note reviewed.  Constitutional:      General: He is not in acute distress.    Appearance: Normal appearance. He is obese. He is not ill-appearing, toxic-appearing or diaphoretic.  HENT:     Head: Normocephalic and atraumatic.     Right Ear: External ear normal.     Left Ear: External ear normal.     Nose: Nose normal.     Mouth/Throat:     Mouth: Mucous membranes are moist.     Pharynx: Oropharynx is clear.  Eyes:     General:  No scleral icterus.       Right eye: No discharge.        Left eye: No discharge.     Extraocular Movements: Extraocular movements intact.     Conjunctiva/sclera: Conjunctivae normal.     Pupils: Pupils are equal, round, and reactive to light.  Cardiovascular:     Rate and Rhythm: Normal rate and regular rhythm.     Pulses: Normal pulses.     Heart sounds: Normal heart sounds. No murmur. No friction rub. No gallop.   Pulmonary:     Effort: Pulmonary effort is normal. No respiratory distress.     Breath sounds: Normal breath sounds. No stridor. No wheezing,  rhonchi or rales.  Chest:     Chest wall: No tenderness.  Musculoskeletal:        General: Normal range of motion.     Cervical back: Normal range of motion and neck supple.  Skin:    General: Skin is warm and dry.     Capillary Refill: Capillary refill takes less than 2 seconds.     Coloration: Skin is not jaundiced or pale.     Findings: No bruising, erythema, lesion or rash.  Neurological:     General: No focal deficit present.     Mental Status: He is alert and oriented to person, place, and time. Mental status is at baseline.  Psychiatric:        Mood and Affect: Mood normal.        Behavior: Behavior normal.        Thought Content: Thought content normal.        Judgment: Judgment normal.     Results for orders placed or performed in visit on 03/30/19  Microscopic Examination   URINE  Result Value Ref Range   WBC, UA 0-5 0 - 5 /hpf   RBC None seen 0 - 2 /hpf   Epithelial Cells (non renal) None seen 0 - 10 /hpf   Casts Present (A) None seen /lpf   Cast Type Granular casts (A) N/A   Crystals Present (A) N/A   Crystal Type Amorphous Sediment N/A   Mucus, UA Present (A) Not Estab.   Bacteria, UA None seen None seen/Few  Urinalysis, Complete  Result Value Ref Range   Specific Gravity, UA 1.025 1.005 - 1.030   pH, UA 7.0 5.0 - 7.5   Color, UA Yellow Yellow   Appearance Ur Cloudy (A) Clear   Leukocytes,UA Negative Negative   Protein,UA Trace (A) Negative/Trace   Glucose, UA Negative Negative   Ketones, UA Negative Negative   RBC, UA Negative Negative   Bilirubin, UA Negative Negative   Urobilinogen, Ur 0.2 0.2 - 1.0 mg/dL   Nitrite, UA Negative Negative   Microscopic Examination See below:       Assessment & Plan:   Problem List Items Addressed This Visit      Other   Morbid obesity (HCC)    Would like to try something again- will start saxenda if he can afford it and recheck 1 month for tolerance. Call with any concerns. Continue to monitor.       Relevant  Medications   Liraglutide -Weight Management (SAXENDA) 18 MG/3ML SOPN    Other Visit Diagnoses    Situational anxiety    -  Primary   Doing better. Tolerating the buspar well. Continue to monitor. Call with any concerns.    Elevated blood-pressure reading without diagnosis of hypertension  Doing well today. Will check labs today. Await results. Call with any concerns.    Relevant Orders   Basic metabolic panel   Microalbumin, Urine Waived   Immunization due       Flu shot given today.   Relevant Orders   Flu Vaccine QUAD 6+ mos PF IM (Fluarix Quad PF) (Completed)       Follow up plan: Return in about 4 weeks (around 11/03/2019).

## 2019-10-06 NOTE — Assessment & Plan Note (Signed)
Would like to try something again- will start saxenda if he can afford it and recheck 1 month for tolerance. Call with any concerns. Continue to monitor.

## 2019-10-06 NOTE — Patient Instructions (Signed)

## 2019-10-07 ENCOUNTER — Ambulatory Visit: Payer: BC Managed Care – PPO | Admitting: Family Medicine

## 2019-10-07 LAB — BASIC METABOLIC PANEL
BUN/Creatinine Ratio: 10 (ref 9–20)
BUN: 11 mg/dL (ref 6–20)
CO2: 23 mmol/L (ref 20–29)
Calcium: 8.8 mg/dL (ref 8.7–10.2)
Chloride: 104 mmol/L (ref 96–106)
Creatinine, Ser: 1.06 mg/dL (ref 0.76–1.27)
GFR calc Af Amer: 105 mL/min/{1.73_m2} (ref >59)
GFR calc non Af Amer: 90 mL/min/{1.73_m2} (ref >59)
Glucose: 119 mg/dL — ABNORMAL HIGH (ref 65–99)
Potassium: 4.8 mmol/L (ref 3.5–5.2)
Sodium: 142 mmol/L (ref 134–144)

## 2019-11-05 ENCOUNTER — Ambulatory Visit: Payer: BC Managed Care – PPO | Admitting: Family Medicine

## 2019-12-01 ENCOUNTER — Ambulatory Visit: Payer: BC Managed Care – PPO | Admitting: Family Medicine

## 2020-01-01 ENCOUNTER — Other Ambulatory Visit: Payer: Self-pay

## 2020-01-01 ENCOUNTER — Ambulatory Visit (INDEPENDENT_AMBULATORY_CARE_PROVIDER_SITE_OTHER): Payer: BC Managed Care – PPO | Admitting: Family Medicine

## 2020-01-01 ENCOUNTER — Encounter: Payer: Self-pay | Admitting: Family Medicine

## 2020-01-01 DIAGNOSIS — F418 Other specified anxiety disorders: Secondary | ICD-10-CM

## 2020-01-01 MED ORDER — BUSPIRONE HCL 7.5 MG PO TABS: 7.5000 mg | ORAL_TABLET | Freq: Three times a day (TID) | ORAL | 3 refills | Status: DC

## 2020-01-01 MED ORDER — CONTRAVE 8-90 MG PO TB12: ORAL_TABLET | ORAL | 1 refills | Status: DC

## 2020-01-01 NOTE — Assessment & Plan Note (Signed)
Would like to restart contrave. Did very well on it before until his insurance stopped paying for him. Congratulated patient on his 13lb weight loss with diet and exercise. Call with any concerns. Recheck 6 weeks.

## 2020-01-01 NOTE — Progress Notes (Signed)
BP 123/84 (BP Location: Left Arm, Patient Position: Sitting, Cuff Size: Large)   Pulse 95   Temp 97.9 F (36.6 C) (Oral)   Ht 5' 8.5" (1.74 m)   Wt (!) 338 lb 3.2 oz (153.4 kg)   SpO2 95%   BMI 50.67 kg/m    Subjective:    Patient ID: Edward Price, male    DOB: 31-Jul-1986, 34 y.o.   MRN: [id number]  HPI: Edward Price is a 34 y.o. male  Chief Complaint  Patient presents with  . Obesity   WEIGHT GAIN- changed jobs and Duration: chronic Previous attempts at weight loss: yes, diet, exercise, weight watchers, contrave, belviq Complications of obesity: none Peak weight: 351 Weight loss goal: 250  Weight loss to date: 13lbs Requesting obesity pharmacotherapy: yes Current weight loss supplements/medications: no Previous weight loss supplements/meds: yes  ANXIETY/STRESS- doing better with the buspar. Feeling well.  Duration:stable Anxious mood: yes  Excessive worrying: yes Irritability: yes  Sweating: no Nausea: no Palpitations:no Hyperventilation: no Panic attacks: no Agoraphobia: no  Obscessions/compulsions: no Depressed mood: no Depression screen [facility] 2/9 01/01/2020 09/16/2019 06/10/2018 10/02/2016 04/18/2016  Decreased Interest 0 1 0 0 0  Down, Depressed, Hopeless 0 2 0 0 0  PHQ - 2 Score 0 3 0 0 0  Altered sleeping 0 0 0 0 0  Tired, decreased energy 1 1 3  0 0  Change in appetite 3 0 3 1 2   Feeling bad or failure about yourself  0 0 0 0 0  Trouble concentrating 0 3 0 0 0  Moving slowly or fidgety/restless 1 0 1 0 0  Suicidal thoughts 0 0 0 0 0  PHQ-9 Score 5 7 7 1 2   Difficult doing work/chores - Not difficult at all Somewhat difficult - -   Anhedonia: no Weight changes: no Insomnia: no   Hypersomnia: no Fatigue/loss of energy: yes Feelings of worthlessness: no Feelings of guilt: no Impaired concentration/indecisiveness: no Suicidal ideations: no  Crying spells: no Recent Stressors/Life Changes: no   Relationship problems: no   Family stress: no    Financial stress: no    Job stress: no    Recent death/loss: no  Relevant past medical, surgical, family and social history reviewed and updated as indicated. Interim medical history since our last visit reviewed. Allergies and medications reviewed and updated.  Review of Systems  Constitutional: Negative.   Respiratory: Negative.   Cardiovascular: Negative.   Gastrointestinal: Negative.   Musculoskeletal: Negative.   Psychiatric/Behavioral: Negative.     Per HPI unless specifically indicated above     Objective:    BP 123/84 (BP Location: Left Arm, Patient Position: Sitting, Cuff Size: Large)   Pulse 95   Temp 97.9 F (36.6 C) (Oral)   Ht 5' 8.5" (1.74 m)   Wt (!) 338 lb 3.2 oz (153.4 kg)   SpO2 95%   BMI 50.67 kg/m   Wt Readings from Last 3 Encounters:  01/01/20 (!) 338 lb 3.2 oz (153.4 kg)  09/16/19 (!) 351 lb (159.2 kg)  03/30/19 (!) 350 lb (158.8 kg)    Physical Exam Vitals and nursing note reviewed.  Constitutional:      General: He is not in acute distress.    Appearance: Normal appearance. He is not ill-appearing, toxic-appearing or diaphoretic.  HENT:     Head: Normocephalic and atraumatic.     Right Ear: External ear normal.     Left Ear: External ear normal.     Nose: Nose  normal.     Mouth/Throat:     Mouth: Mucous membranes are moist.     Pharynx: Oropharynx is clear.  Eyes:     General: No scleral icterus.       Right eye: No discharge.        Left eye: No discharge.     Extraocular Movements: Extraocular movements intact.     Conjunctiva/sclera: Conjunctivae normal.     Pupils: Pupils are equal, round, and reactive to light.  Cardiovascular:     Rate and Rhythm: Normal rate and regular rhythm.     Pulses: Normal pulses.     Heart sounds: Normal heart sounds. No murmur. No friction rub. No gallop.   Pulmonary:     Effort: Pulmonary effort is normal. No respiratory distress.     Breath sounds: Normal breath sounds. No stridor. No wheezing,  rhonchi or rales.  Chest:     Chest wall: No tenderness.  Musculoskeletal:        General: Normal range of motion.     Cervical back: Normal range of motion and neck supple.  Skin:    General: Skin is warm and dry.     Capillary Refill: Capillary refill takes less than 2 seconds.     Coloration: Skin is not jaundiced or pale.     Findings: No bruising, erythema, lesion or rash.  Neurological:     General: No focal deficit present.     Mental Status: He is alert and oriented to person, place, and time. Mental status is at baseline.  Psychiatric:        Mood and Affect: Mood normal.        Behavior: Behavior normal.        Thought Content: Thought content normal.        Judgment: Judgment normal.     Results for orders placed or performed in visit on 10/06/19  Basic metabolic panel  Result Value Ref Range   Glucose 119 (H) 65 - 99 mg/dL   BUN 11 6 - 20 mg/dL   Creatinine, Ser 2.29 0.76 - 1.27 mg/dL   GFR calc non Af Amer 90 >59 mL/min/1.73   GFR calc Af Amer 105 >59 mL/min/1.73   BUN/Creatinine Ratio 10 9 - 20   Sodium 142 134 - 144 mmol/L   Potassium 4.8 3.5 - 5.2 mmol/L   Chloride 104 96 - 106 mmol/L   CO2 23 20 - 29 mmol/L   Calcium 8.8 8.7 - 10.2 mg/dL  Microalbumin, Urine Waived  Result Value Ref Range   Microalb, Ur Waived 10 0 - 19 mg/L   Creatinine, Urine Waived 200 10 - 300 mg/dL   Microalb/Creat Ratio <30 <30 mg/g      Assessment & Plan:   Problem List Items Addressed This Visit      Other   Morbid obesity (HCC) - Primary    Would like to restart contrave. Did very well on it before until his insurance stopped paying for him. Congratulated patient on his 13lb weight loss with diet and exercise. Call with any concerns. Recheck 6 weeks.       Relevant Medications   Naltrexone-buPROPion HCl ER (CONTRAVE) 8-90 MG TB12    Other Visit Diagnoses    Situational anxiety       Doign well on the buspar. Continue to monitor. Call with any concerns. Refills given  today.   Relevant Medications   busPIRone (BUSPAR) 7.5 MG tablet  Follow up plan: Return in about 6 weeks (around 02/12/2020).

## 2020-01-05 ENCOUNTER — Telehealth: Payer: Self-pay

## 2020-01-05 NOTE — Telephone Encounter (Signed)
Prior Authorization initiated via CoverMyMeds for Contrave Key: [id number]

## 2020-01-06 NOTE — Telephone Encounter (Signed)
PA denied. Medication is not on health plan.   But they will approve the drug as stated below:   Per your health plan's criteria, this drug is covered if you meet the following: You have tried or cannot use at least three drugs from the following: (a) Diethylpropion*. (b) Lomaira or phentermine*. (c) Qsymia*. (d) Saxenda*

## 2020-01-07 NOTE — Telephone Encounter (Signed)
Noted  

## 2020-01-11 ENCOUNTER — Encounter: Payer: Self-pay | Admitting: Family Medicine

## 2020-01-12 MED ORDER — SAXENDA 18 MG/3ML ~~LOC~~ SOPN: PEN_INJECTOR | SUBCUTANEOUS | 3 refills | Status: DC

## 2020-01-12 MED ORDER — PEN NEEDLES 31G X 8 MM MISC: 1.0000 | Freq: Every day | 3 refills | Status: DC

## 2020-01-20 ENCOUNTER — Other Ambulatory Visit: Payer: Self-pay

## 2020-01-20 ENCOUNTER — Encounter: Payer: Self-pay | Admitting: Family Medicine

## 2020-01-20 ENCOUNTER — Ambulatory Visit (INDEPENDENT_AMBULATORY_CARE_PROVIDER_SITE_OTHER): Payer: BC Managed Care – PPO | Admitting: Family Medicine

## 2020-01-20 VITALS — BP 137/78 | HR 100 | Temp 97.3°F | Ht 68.0 in | Wt 336.0 lb

## 2020-01-20 DIAGNOSIS — Z024 Encounter for examination for driving license: Secondary | ICD-10-CM | POA: Diagnosis not present

## 2020-01-20 DIAGNOSIS — Z0289 Encounter for other administrative examinations: Secondary | ICD-10-CM

## 2020-01-20 LAB — POCT URINALYSIS DIPSTICK
Bilirubin, UA: NEGATIVE
Blood, UA: NEGATIVE
Glucose, UA: NEGATIVE
Ketones, UA: NEGATIVE
Leukocytes, UA: NEGATIVE
Nitrite, UA: NEGATIVE
Protein, UA: NEGATIVE
Spec Grav, UA: 1.015 (ref 1.010–1.025)
Urobilinogen, UA: 0.2 E.U./dL
pH, UA: 5 (ref 5.0–8.0)

## 2020-01-20 NOTE — Addendum Note (Signed)
Addended by: Lonna Cobb on: 01/20/2020 04:22 PM   Modules accepted: Orders

## 2020-01-20 NOTE — Assessment & Plan Note (Signed)
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

## 2020-01-20 NOTE — Progress Notes (Signed)
Subjective:    Patient ID: Edward Price, male    DOB: Oct 11, 1985, 34 y.o.   MRN: [id number]  Edward Price is a 34 y.o. male presenting on 01/20/2020 for Employment Physical (DOT Physical)   HPI  DOT PE  Depression screen [facility] 2/9 01/01/2020 09/16/2019 06/10/2018  Decreased Interest 0 1 0  Down, Depressed, Hopeless 0 2 0  PHQ - 2 Score 0 3 0  Altered sleeping 0 0 0  Tired, decreased energy 1 1 3   Change in appetite 3 0 3  Feeling bad or failure about yourself  0 0 0  Trouble concentrating 0 3 0  Moving slowly or fidgety/restless 1 0 1  Suicidal thoughts 0 0 0  PHQ-9 Score 5 7 7   Difficult doing work/chores - Not difficult at all Somewhat difficult    Social History   Tobacco Use  . Smoking status: Current Some Day Smoker    Types: Cigarettes  . Smokeless tobacco: Current User    Types: Chew  Substance Use Topics  . Alcohol use: No  . Drug use: No    Review of Systems  Constitutional: Negative.   HENT: Negative.   Eyes: Negative.   Respiratory: Negative.   Cardiovascular: Negative.   Gastrointestinal: Negative.   Endocrine: Negative.   Genitourinary: Negative.   Musculoskeletal: Negative.   Skin: Negative.   Allergic/Immunologic: Negative.   Neurological: Negative.   Hematological: Negative.   Psychiatric/Behavioral: Negative.    Per HPI unless specifically indicated above     Objective:    BP 137/78 (BP Location: Left Arm, Patient Position: Sitting, Cuff Size: Large)   Pulse 100   Temp (!) 97.3 F (36.3 C) (Temporal)   Ht 5\' 8"  (1.727 m)   Wt (!) 336 lb (152.4 kg)   BMI 51.09 kg/m   Wt Readings from Last 3 Encounters:  01/20/20 (!) 336 lb (152.4 kg)  01/01/20 (!) 338 lb 3.2 oz (153.4 kg)  09/16/19 (!) 351 lb (159.2 kg)    Physical Exam Vitals reviewed.  Constitutional:      General: He is not in acute distress.    Appearance: Normal appearance. He is well-developed and well-groomed. He is obese. He is not ill-appearing or toxic-appearing.    HENT:     Head: Normocephalic.     Right Ear: Tympanic membrane, ear canal and external ear normal. There is no impacted cerumen.     Left Ear: Tympanic membrane, ear canal and external ear normal. There is no impacted cerumen.     Nose: Nose normal. No congestion or rhinorrhea.     Mouth/Throat:     Mouth: Mucous membranes are moist.     Pharynx: Oropharynx is clear. No oropharyngeal exudate or posterior oropharyngeal erythema.  Eyes:     General: Lids are normal. Vision grossly intact. No scleral icterus.       Right eye: No discharge.        Left eye: No discharge.     Extraocular Movements: Extraocular movements intact.     Conjunctiva/sclera: Conjunctivae normal.     Pupils: Pupils are equal, round, and reactive to light.  Cardiovascular:     Rate and Rhythm: Normal rate and regular rhythm.     Pulses: Normal pulses.          Dorsalis pedis pulses are 2+ on the right side and 2+ on the left side.     Heart sounds: Normal heart sounds. No murmur. No friction rub. No gallop.  Pulmonary:     Effort: Pulmonary effort is normal. No respiratory distress.     Breath sounds: Normal breath sounds. No wheezing, rhonchi or rales.  Abdominal:     General: Abdomen is flat. Bowel sounds are normal. There is no distension.     Palpations: Abdomen is soft. There is no hepatomegaly, splenomegaly or mass.     Tenderness: There is no abdominal tenderness. There is no right CVA tenderness, left CVA tenderness, guarding or rebound.     Hernia: No hernia is present.  Musculoskeletal:        General: Normal range of motion.     Cervical back: Normal range of motion and neck supple. No rigidity or tenderness.     Right lower leg: No edema.     Left lower leg: No edema.  Feet:     Right foot:     Skin integrity: Skin integrity normal.     Left foot:     Skin integrity: Skin integrity normal.  Lymphadenopathy:     Cervical: No cervical adenopathy.  Skin:    General: Skin is warm and dry.      Capillary Refill: Capillary refill takes less than 2 seconds.  Neurological:     General: No focal deficit present.     Mental Status: He is alert and oriented to person, place, and time.     Cranial Nerves: Cranial nerves are intact. No cranial nerve deficit.     Sensory: Sensation is intact. No sensory deficit.     Motor: Motor function is intact. No weakness.     Coordination: Coordination is intact. Coordination normal.     Gait: Gait is intact. Gait normal.     Deep Tendon Reflexes: Reflexes are normal and symmetric. Reflexes normal.  Psychiatric:        Attention and Perception: Attention and perception normal.        Mood and Affect: Mood and affect normal.        Speech: Speech normal.        Behavior: Behavior normal. Behavior is cooperative.        Thought Content: Thought content normal.        Cognition and Memory: Cognition and memory normal.        Judgment: Judgment normal.     Results for orders placed or performed in visit on 72/53/66  Basic metabolic panel  Result Value Ref Range   Glucose 119 (H) 65 - 99 mg/dL   BUN 11 6 - 20 mg/dL   Creatinine, Ser 1.06 0.76 - 1.27 mg/dL   GFR calc non Af Amer 90 >59 mL/min/1.73   GFR calc Af Amer 105 >59 mL/min/1.73   BUN/Creatinine Ratio 10 9 - 20   Sodium 142 134 - 144 mmol/L   Potassium 4.8 3.5 - 5.2 mmol/L   Chloride 104 96 - 106 mmol/L   CO2 23 20 - 29 mmol/L   Calcium 8.8 8.7 - 10.2 mg/dL  Microalbumin, Urine Waived  Result Value Ref Range   Microalb, Ur Waived 10 0 - 19 mg/L   Creatinine, Urine Waived 200 10 - 300 mg/dL   Microalb/Creat Ratio <30 <30 mg/g      Assessment & Plan:   Problem List Items Addressed This Visit      Other   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       Other Visit Diagnoses  Examination, physical, employee    -  Primary   Relevant Orders   Visual acuity screening      No orders of  the defined types were placed in this encounter.     Follow up plan: Return in about 2 years (around 01/19/2022) for DOT PE.   Charlaine Dalton, FNP Family Nurse Practitioner [facility] 01/20/2020, 3:06 PM

## 2020-02-12 ENCOUNTER — Ambulatory Visit: Payer: BC Managed Care – PPO | Admitting: Family Medicine

## 2020-02-25 ENCOUNTER — Telehealth: Payer: Self-pay

## 2020-02-25 NOTE — Telephone Encounter (Signed)
Appeal was denied.  Reasoning: Bernie Covey is not a covered pharmacy benefit and excluded from patient's plan.  We can try to do a second appeal, but I'm unsure if they'll still cover it since it is a plan exclusion.

## 2020-03-05 DIAGNOSIS — L089 Local infection of the skin and subcutaneous tissue, unspecified: Secondary | ICD-10-CM | POA: Diagnosis not present

## 2020-03-05 DIAGNOSIS — L720 Epidermal cyst: Secondary | ICD-10-CM | POA: Diagnosis not present

## 2020-05-02 ENCOUNTER — Telehealth (INDEPENDENT_AMBULATORY_CARE_PROVIDER_SITE_OTHER): Payer: BC Managed Care – PPO | Admitting: Family Medicine

## 2020-05-02 ENCOUNTER — Encounter: Payer: Self-pay | Admitting: Family Medicine

## 2020-05-02 DIAGNOSIS — R0989 Other specified symptoms and signs involving the circulatory and respiratory systems: Secondary | ICD-10-CM | POA: Diagnosis not present

## 2020-05-02 DIAGNOSIS — Z7189 Other specified counseling: Secondary | ICD-10-CM | POA: Diagnosis not present

## 2020-05-02 DIAGNOSIS — Z7185 Encounter for immunization safety counseling: Secondary | ICD-10-CM

## 2020-05-02 DIAGNOSIS — F418 Other specified anxiety disorders: Secondary | ICD-10-CM

## 2020-05-02 MED ORDER — BUSPIRONE HCL 7.5 MG PO TABS
7.5000 mg | ORAL_TABLET | Freq: Three times a day (TID) | ORAL | 1 refills | Status: DC | PRN
Start: 2020-05-02 — End: 2020-05-30

## 2020-05-02 MED ORDER — PREDNISONE 50 MG PO TABS: 50.0000 mg | ORAL_TABLET | Freq: Every day | ORAL | 0 refills | Status: DC

## 2020-05-02 MED ORDER — SERTRALINE HCL 50 MG PO TABS: ORAL_TABLET | ORAL | 2 refills | Status: DC

## 2020-05-02 NOTE — Progress Notes (Signed)
There were no vitals taken for this visit.   Subjective:    Patient ID: Edward Price, male    DOB: 1986/03/26, 34 y.o.   MRN: [id number]  HPI: Edward Price is a 34 y.o. male  Chief Complaint  Patient presents with  . Advice Only    covid vaccine.  . Anxiety    med follow up   ANXIETY/STRESS Duration: chronic, worse in the past couple of months.  Status:worse Anxious mood: yes  Excessive worrying: yes Irritability: yes  Sweating: no Nausea: no Palpitations:no Hyperventilation: no Panic attacks: no Agoraphobia: no  Obscessions/compulsions: no Depressed mood: no Depression screen [facility] 2/9 05/02/2020 01/01/2020 09/16/2019 06/10/2018 10/02/2016  Decreased Interest 0 0 1 0 0  Down, Depressed, Hopeless 0 0 2 0 0  PHQ - 2 Score 0 0 3 0 0  Altered sleeping 0 0 0 0 0  Tired, decreased energy 1 1 1 3  0  Change in appetite 3 3 0 3 1  Feeling bad or failure about yourself  0 0 0 0 0  Trouble concentrating 0 0 3 0 0  Moving slowly or fidgety/restless 0 1 0 1 0  Suicidal thoughts 0 0 0 0 0  PHQ-9 Score 4 5 7 7 1   Difficult doing work/chores - - Not difficult at all Somewhat difficult -   GAD 7 : Generalized Anxiety Score 05/02/2020 06/10/2018  Nervous, Anxious, on Edge 1 0  Control/stop worrying 3 1  Worry too much - different things 3 1  Trouble relaxing 0 0  Restless 0 0  Easily annoyed or irritable 1 1  Afraid - awful might happen 3 0  Total GAD 7 Score 11 3  Anxiety Difficulty - Not difficult at all   Anhedonia: no Weight changes: no Insomnia: no   Hypersomnia: no Fatigue/loss of energy: yes Feelings of worthlessness: yes Feelings of guilt: yes Impaired concentration/indecisiveness: no Suicidal ideations: no  Crying spells: yes Recent Stressors/Life Changes: yes   Relationship problems: no   Family stress: no     Financial stress: yes    Job stress: yes    Recent death/loss: no  Has been having some congestion in his chest and his sinuses. No other symptoms.  No coughing. No fevers, no chills. He is otherwise feeling well with no other concerns or complaints at this time.   Relevant past medical, surgical, family and social history reviewed and updated as indicated. Interim medical history since our last visit reviewed. Allergies and medications reviewed and updated.  Review of Systems  Constitutional: Negative.   HENT: Positive for congestion. Negative for dental problem, drooling, ear discharge, ear pain, facial swelling, hearing loss, mouth sores, nosebleeds, postnasal drip, rhinorrhea, sinus pressure, sinus pain, sneezing, sore throat, tinnitus, trouble swallowing and voice change.   Respiratory: Negative.   Cardiovascular: Negative.  Negative for chest pain, palpitations and leg swelling.       Chest heaviness   Psychiatric/Behavioral: Positive for dysphoric mood. Negative for agitation, behavioral problems, confusion, decreased concentration, hallucinations, self-injury, sleep disturbance and suicidal ideas. The patient is nervous/anxious. The patient is not hyperactive.     Per HPI unless specifically indicated above     Objective:    There were no vitals taken for this visit.  Wt Readings from Last 3 Encounters:  01/20/20 (!) 336 lb (152.4 kg)  01/01/20 (!) 338 lb 3.2 oz (153.4 kg)  09/16/19 (!) 351 lb (159.2 kg)    Physical Exam Vitals and nursing note  reviewed.  Constitutional:      General: He is not in acute distress.    Appearance: Normal appearance. He is not ill-appearing, toxic-appearing or diaphoretic.  HENT:     Head: Normocephalic and atraumatic.     Right Ear: External ear normal.     Left Ear: External ear normal.     Nose: Nose normal.     Mouth/Throat:     Mouth: Mucous membranes are moist.     Pharynx: Oropharynx is clear.  Eyes:     General: No scleral icterus.       Right eye: No discharge.        Left eye: No discharge.     Conjunctiva/sclera: Conjunctivae normal.     Pupils: Pupils are equal, round,  and reactive to light.  Pulmonary:     Effort: Pulmonary effort is normal. No respiratory distress.     Comments: Speaking in full sentences Musculoskeletal:        General: Normal range of motion.     Cervical back: Normal range of motion.  Skin:    Coloration: Skin is not jaundiced or pale.     Findings: No bruising, erythema, lesion or rash.  Neurological:     Mental Status: He is alert and oriented to person, place, and time. Mental status is at baseline.  Psychiatric:        Mood and Affect: Mood normal.        Behavior: Behavior normal.        Thought Content: Thought content normal.        Judgment: Judgment normal.     Results for orders placed or performed in visit on 01/20/20  POCT Urinalysis Dipstick  Result Value Ref Range   Color, UA Yellow    Clarity, UA clear    Glucose, UA Negative Negative   Bilirubin, UA negative    Ketones, UA negative    Spec Grav, UA 1.015 1.010 - 1.025   Blood, UA negative    pH, UA 5.0 5.0 - 8.0   Protein, UA Negative Negative   Urobilinogen, UA 0.2 0.2 or 1.0 E.U./dL   Nitrite, UA negative    Leukocytes, UA Negative Negative   Appearance     Odor        Assessment & Plan:   Problem List Items Addressed This Visit      Other   Situational anxiety - Primary    Worsening. Will start zoloft and continue buspar. Recheck 1 month. Call with any concerns.       Relevant Medications   busPIRone (BUSPAR) 7.5 MG tablet   sertraline (ZOLOFT) 50 MG tablet    Other Visit Diagnoses    Chest congestion       No other symptoms. Will treat with prednisone burst. Call if not getting better or getting worse.    Vaccine counseling       Discussed options, risks/benefits for COVID vaccines. All questions answered.        Follow up plan: Return in about 4 weeks (around 05/30/2020).   . This visit was completed via MyChart due to the restrictions of the COVID-19 pandemic. All issues as above were discussed and addressed. Physical exam  was done as above through visual confirmation on MyChart. If it was felt that the patient should be evaluated in the office, they were directed there. The patient verbally consented to this visit. . Location of the patient: work . Location of the provider: work . Those  involved with this call:  . Provider: Olevia Perches, DO . CMA: Floydene Flock, RMA . Front Desk/Registration: Adela Ports  . Time spent on call: 25 minutes with patient face to face via video conference. More than 50% of this time was spent in counseling and coordination of care. 40 minutes total spent in review of patient's record and preparation of their chart.

## 2020-05-02 NOTE — Assessment & Plan Note (Signed)
Worsening. Will start zoloft and continue buspar. Recheck 1 month. Call with any concerns.

## 2020-05-30 ENCOUNTER — Telehealth (INDEPENDENT_AMBULATORY_CARE_PROVIDER_SITE_OTHER): Payer: BC Managed Care – PPO | Admitting: Family Medicine

## 2020-05-30 ENCOUNTER — Encounter: Payer: Self-pay | Admitting: Family Medicine

## 2020-05-30 DIAGNOSIS — F418 Other specified anxiety disorders: Secondary | ICD-10-CM | POA: Diagnosis not present

## 2020-05-30 MED ORDER — BUSPIRONE HCL 7.5 MG PO TABS
7.5000 mg | ORAL_TABLET | Freq: Three times a day (TID) | ORAL | 1 refills | Status: DC | PRN
Start: 2020-05-30 — End: 2020-09-23

## 2020-05-30 MED ORDER — SERTRALINE HCL 50 MG PO TABS
50.0000 mg | ORAL_TABLET | Freq: Every day | ORAL | 1 refills | Status: DC
Start: 2020-05-30 — End: 2020-11-25

## 2020-05-30 NOTE — Progress Notes (Signed)
There were no vitals taken for this visit.   Subjective:    Patient ID: Edward Price, male    DOB: 1985-09-24, 34 y.o.   MRN: [id number]  HPI: Edward Price is a 34 y.o. male  Chief Complaint  Patient presents with  . Anxiety   ANXIETY/STRESS Duration: months Status:better Anxious mood: yes  Excessive worrying: no Irritability: yes  Sweating: no Nausea: no Palpitations:no Hyperventilation: no Panic attacks: no Agoraphobia: no  Obscessions/compulsions: no Depressed mood: no Depression screen [facility] 2/9 05/30/2020 05/02/2020 01/01/2020 09/16/2019 06/10/2018  Decreased Interest 0 0 0 1 0  Down, Depressed, Hopeless 0 0 0 2 0  PHQ - 2 Score 0 0 0 3 0  Altered sleeping 0 0 0 0 0  Tired, decreased energy 0 1 1 1 3   Change in appetite 0 3 3 0 3  Feeling bad or failure about yourself  0 0 0 0 0  Trouble concentrating 0 0 0 3 0  Moving slowly or fidgety/restless 0 0 1 0 1  Suicidal thoughts 0 0 0 0 0  PHQ-9 Score 0 4 5 7 7   Difficult doing work/chores - - - Not difficult at all Somewhat difficult   GAD 7 : Generalized Anxiety Score 05/30/2020 05/02/2020 06/10/2018  Nervous, Anxious, on Edge 0 1 0  Control/stop worrying 0 3 1  Worry too much - different things 0 3 1  Trouble relaxing 0 0 0  Restless 0 0 0  Easily annoyed or irritable 0 1 1  Afraid - awful might happen 0 3 0  Total GAD 7 Score 0 11 3  Anxiety Difficulty - - Not difficult at all   Anhedonia: no Weight changes: no Insomnia: no   Hypersomnia: no Fatigue/loss of energy: no Feelings of worthlessness: no Feelings of guilt: no Impaired concentration/indecisiveness: no Suicidal ideations: no  Crying spells: no Recent Stressors/Life Changes: yes   Relationship problems: no   Family stress: no     Financial stress: no    Job stress: yes    Recent death/loss: no  Relevant past medical, surgical, family and social history reviewed and updated as indicated. Interim medical history since our last visit  reviewed. Allergies and medications reviewed and updated.  Review of Systems  Constitutional: Negative.   Respiratory: Negative.   Cardiovascular: Negative.   Gastrointestinal: Negative.   Musculoskeletal: Negative.   Neurological: Negative.   Psychiatric/Behavioral: Negative.     Per HPI unless specifically indicated above     Objective:    There were no vitals taken for this visit.  Wt Readings from Last 3 Encounters:  01/20/20 (!) 336 lb (152.4 kg)  01/01/20 (!) 338 lb 3.2 oz (153.4 kg)  09/16/19 (!) 351 lb (159.2 kg)    Physical Exam Vitals and nursing note reviewed.  Constitutional:      General: He is not in acute distress.    Appearance: Normal appearance. He is not ill-appearing, toxic-appearing or diaphoretic.  HENT:     Head: Normocephalic and atraumatic.     Right Ear: External ear normal.     Left Ear: External ear normal.     Nose: Nose normal.     Mouth/Throat:     Mouth: Mucous membranes are moist.     Pharynx: Oropharynx is clear.  Eyes:     General: No scleral icterus.       Right eye: No discharge.        Left eye: No discharge.  Extraocular Movements: Extraocular movements intact.     Conjunctiva/sclera: Conjunctivae normal.     Pupils: Pupils are equal, round, and reactive to light.  Cardiovascular:     Rate and Rhythm: Normal rate and regular rhythm.     Pulses: Normal pulses.     Heart sounds: Normal heart sounds. No murmur heard.  No friction rub. No gallop.   Pulmonary:     Effort: Pulmonary effort is normal. No respiratory distress.     Breath sounds: Normal breath sounds. No stridor. No wheezing, rhonchi or rales.  Chest:     Chest wall: No tenderness.  Musculoskeletal:        General: Normal range of motion.     Cervical back: Normal range of motion and neck supple.  Skin:    General: Skin is warm and dry.     Capillary Refill: Capillary refill takes less than 2 seconds.     Coloration: Skin is not jaundiced or pale.      Findings: No bruising, erythema, lesion or rash.  Neurological:     General: No focal deficit present.     Mental Status: He is alert and oriented to person, place, and time. Mental status is at baseline.  Psychiatric:        Mood and Affect: Mood normal.        Behavior: Behavior normal.        Thought Content: Thought content normal.        Judgment: Judgment normal.     Results for orders placed or performed in visit on 01/20/20  POCT Urinalysis Dipstick  Result Value Ref Range   Color, UA Yellow    Clarity, UA clear    Glucose, UA Negative Negative   Bilirubin, UA negative    Ketones, UA negative    Spec Grav, UA 1.015 1.010 - 1.025   Blood, UA negative    pH, UA 5.0 5.0 - 8.0   Protein, UA Negative Negative   Urobilinogen, UA 0.2 0.2 or 1.0 E.U./dL   Nitrite, UA negative    Leukocytes, UA Negative Negative   Appearance     Odor        Assessment & Plan:   Problem List Items Addressed This Visit      Other   Situational anxiety - Primary    Doing well on current regimen. Continue current regimen. Continue to monitor. Call with any concerns. Refills for 90 days sent in today.       Relevant Medications   sertraline (ZOLOFT) 50 MG tablet   busPIRone (BUSPAR) 7.5 MG tablet       Follow up plan: Return in about 6 months (around 11/27/2020) for physical.   . This visit was completed via MyChart due to the restrictions of the COVID-19 pandemic. All issues as above were discussed and addressed. Physical exam was done as above through visual confirmation on MyChart. If it was felt that the patient should be evaluated in the office, they were directed there. The patient verbally consented to this visit. . Location of the patient: home . Location of the provider: work . Those involved with this call:  . Provider: Olevia Perches, DO . CMA: Elton Sin, CMA . Front Desk/Registration: Adela Ports  . Time spent on call: 15 minutes with patient face to face via  video conference. More than 50% of this time was spent in counseling and coordination of care. 23 minutes total spent in review of patient's record and preparation of  their chart.

## 2020-05-30 NOTE — Assessment & Plan Note (Signed)
Doing well on current regimen. Continue current regimen. Continue to monitor. Call with any concerns. Refills for 90 days sent in today.

## 2020-06-19 DIAGNOSIS — B349 Viral infection, unspecified: Secondary | ICD-10-CM | POA: Diagnosis not present

## 2020-06-19 DIAGNOSIS — R059 Cough, unspecified: Secondary | ICD-10-CM | POA: Diagnosis not present

## 2020-06-19 DIAGNOSIS — R0981 Nasal congestion: Secondary | ICD-10-CM | POA: Diagnosis not present

## 2020-07-15 DIAGNOSIS — L728 Other follicular cysts of the skin and subcutaneous tissue: Secondary | ICD-10-CM | POA: Diagnosis not present

## 2020-07-15 DIAGNOSIS — D2339 Other benign neoplasm of skin of other parts of face: Secondary | ICD-10-CM | POA: Diagnosis not present

## 2020-09-22 ENCOUNTER — Encounter: Payer: Self-pay | Admitting: Family Medicine

## 2020-09-23 ENCOUNTER — Other Ambulatory Visit: Payer: Self-pay

## 2020-09-23 ENCOUNTER — Encounter: Payer: Self-pay | Admitting: Family Medicine

## 2020-09-23 ENCOUNTER — Telehealth (INDEPENDENT_AMBULATORY_CARE_PROVIDER_SITE_OTHER): Payer: BC Managed Care – PPO | Admitting: Family Medicine

## 2020-09-23 VITALS — HR 64 | Temp 98.1°F

## 2020-09-23 DIAGNOSIS — U071 COVID-19: Secondary | ICD-10-CM | POA: Diagnosis not present

## 2020-09-23 MED ORDER — HYDROCOD POLST-CPM POLST ER 10-8 MG/5ML PO SUER
5.0000 mL | Freq: Two times a day (BID) | ORAL | 0 refills | Status: DC | PRN
Start: 2020-09-23 — End: 2020-10-31

## 2020-09-23 MED ORDER — PREDNISONE 50 MG PO TABS: 50.0000 mg | ORAL_TABLET | Freq: Every day | ORAL | 0 refills | Status: DC

## 2020-09-23 NOTE — Progress Notes (Signed)
Pulse 64   Temp 98.1 F (36.7 C)   SpO2 91%    Subjective:    Patient ID: Edward Price, male    DOB: Mar 30, 1986, 35 y.o.   MRN: [id number]  HPI: Edward Price is a 35 y.o. male  Chief Complaint  Patient presents with  . Covid Positive    Positive today, fatigue, cough and chest congestion    UPPER RESPIRATORY TRACT INFECTION- tested positive this afternoon Duration: 4 days Worst symptom: ear pain and chest pain Fever: no Cough: yes Shortness of breath: no Wheezing: no Chest pain: yes, with cough Chest tightness: yes Chest congestion: yes Nasal congestion: yes Runny nose: no Post nasal drip: no Sneezing: no Sore throat: yes Swollen glands: no Sinus pressure: yes Headache: yes Face pain: no Toothache: yes Ear pain: yes  Ear pressure: yes left Eyes red/itching:no Eye drainage/crusting: no  Vomiting: no Rash: no Fatigue: yes Sick contacts: yes Strep contacts: no  Context: worse Recurrent sinusitis: no Relief with OTC cold/cough medications: no  Treatments attempted: cold/sinus, mucinex, anti-histamine and pseudoephedrine   Relevant past medical, surgical, family and social history reviewed and updated as indicated. Interim medical history since our last visit reviewed. Allergies and medications reviewed and updated.  Review of Systems  Constitutional: Positive for fatigue. Negative for activity change, appetite change, chills, diaphoresis, fever and unexpected weight change.  HENT: Positive for congestion, ear pain, rhinorrhea, sinus pressure, sinus pain and sore throat. Negative for dental problem, drooling, ear discharge, facial swelling, hearing loss, mouth sores, nosebleeds, postnasal drip, sneezing, tinnitus, trouble swallowing and voice change.   Respiratory: Positive for cough and chest tightness. Negative for apnea, choking, shortness of breath, wheezing and stridor.   Cardiovascular: Negative.   Gastrointestinal: Negative.   Musculoskeletal:  Negative.   Psychiatric/Behavioral: Negative.     Per HPI unless specifically indicated above     Objective:    Pulse 64   Temp 98.1 F (36.7 C)   SpO2 91%   Wt Readings from Last 3 Encounters:  01/20/20 (!) 336 lb (152.4 kg)  01/01/20 (!) 338 lb 3.2 oz (153.4 kg)  09/16/19 (!) 351 lb (159.2 kg)    Physical Exam Vitals and nursing note reviewed.  Pulmonary:     Effort: Pulmonary effort is normal. No respiratory distress.     Comments: Speaking in full sentences Neurological:     Mental Status: He is alert.  Psychiatric:        Mood and Affect: Mood normal.        Behavior: Behavior normal.        Thought Content: Thought content normal.        Judgment: Judgment normal.     Results for orders placed or performed in visit on 01/20/20  POCT Urinalysis Dipstick  Result Value Ref Range   Color, UA Yellow    Clarity, UA clear    Glucose, UA Negative Negative   Bilirubin, UA negative    Ketones, UA negative    Spec Grav, UA 1.015 1.010 - 1.025   Blood, UA negative    pH, UA 5.0 5.0 - 8.0   Protein, UA Negative Negative   Urobilinogen, UA 0.2 0.2 or 1.0 E.U./dL   Nitrite, UA negative    Leukocytes, UA Negative Negative   Appearance     Odor        Assessment & Plan:   Problem List Items Addressed This Visit   None   Visit Diagnoses  COVID-19    -  Primary   Will treat with prednisone and tussionex. Call if not getting better or getting worse. Continue to monitor. Call with concerns.        Follow up plan: Return if symptoms worsen or fail to improve.    . This visit was completed via telephone due to the restrictions of the COVID-19 pandemic. All issues as above were discussed and addressed but no physical exam was performed. If it was felt that the patient should be evaluated in the office, they were directed there. The patient verbally consented to this visit. Patient was unable to complete an audio/visual visit due to Lack of equipment. Due to the  catastrophic nature of the COVID-19 pandemic, this visit was done through audio contact only. . Location of the patient: home . Location of the provider: home . Those involved with this call:  . Provider: Olevia Perches, DO . CMA: Tiffany Reel, CMA . Front Desk/Registration: Harriet Pho  . Time spent on call: 21 minutes on the phone discussing health concerns. 30 minutes total spent in review of patient's record and preparation of their chart.

## 2020-10-31 ENCOUNTER — Emergency Department: Payer: BC Managed Care – PPO

## 2020-10-31 ENCOUNTER — Other Ambulatory Visit: Payer: Self-pay

## 2020-10-31 ENCOUNTER — Emergency Department
Admission: EM | Admit: 2020-10-31 | Discharge: 2020-10-31 | Disposition: A | Discharge status: Home / Self Care | Payer: BC Managed Care – PPO | Attending: Emergency Medicine | Admitting: Emergency Medicine

## 2020-10-31 DIAGNOSIS — F17210 Nicotine dependence, cigarettes, uncomplicated: Secondary | ICD-10-CM | POA: Insufficient documentation

## 2020-10-31 DIAGNOSIS — R319 Hematuria, unspecified: Secondary | ICD-10-CM | POA: Diagnosis not present

## 2020-10-31 DIAGNOSIS — N200 Calculus of kidney: Secondary | ICD-10-CM

## 2020-10-31 DIAGNOSIS — F17220 Nicotine dependence, chewing tobacco, uncomplicated: Secondary | ICD-10-CM | POA: Insufficient documentation

## 2020-10-31 DIAGNOSIS — N132 Hydronephrosis with renal and ureteral calculous obstruction: Secondary | ICD-10-CM | POA: Diagnosis not present

## 2020-10-31 DIAGNOSIS — R600 Localized edema: Secondary | ICD-10-CM | POA: Diagnosis not present

## 2020-10-31 LAB — CBC
HCT: 47.9 % (ref 39.0–52.0)
Hemoglobin: 15.9 g/dL (ref 13.0–17.0)
MCH: 29.9 pg (ref 26.0–34.0)
MCHC: 33.2 g/dL (ref 30.0–36.0)
MCV: 90.2 fL (ref 80.0–100.0)
Platelets: 320 10*3/uL (ref 150–400)
RBC: 5.31 MIL/uL (ref 4.22–5.81)
RDW: 13.4 % (ref 11.5–15.5)
WBC: 12.1 10*3/uL — ABNORMAL HIGH (ref 4.0–10.5)
nRBC: 0 % (ref 0.0–0.2)

## 2020-10-31 LAB — URINALYSIS, COMPLETE (UACMP) WITH MICROSCOPIC
Bacteria, UA: NONE SEEN
Bilirubin Urine: NEGATIVE
Glucose, UA: NEGATIVE mg/dL
Ketones, ur: NEGATIVE mg/dL
Leukocytes,Ua: NEGATIVE
Nitrite: NEGATIVE
Protein, ur: 30 mg/dL — AB
RBC / HPF: >50 RBC/hpf — ABNORMAL HIGH (ref 0–5)
Specific Gravity, Urine: 1.024 (ref 1.005–1.030)
Squamous Epithelial / HPF: NONE SEEN (ref 0–5)
pH: 5 (ref 5.0–8.0)

## 2020-10-31 LAB — BASIC METABOLIC PANEL
Anion gap: 10 (ref 5–15)
BUN: 15 mg/dL (ref 6–20)
CO2: 23 mmol/L (ref 22–32)
Calcium: 9.2 mg/dL (ref 8.9–10.3)
Chloride: 105 mmol/L (ref 98–111)
Creatinine, Ser: 1.05 mg/dL (ref 0.61–1.24)
GFR, Estimated: >60 mL/min (ref >60)
Glucose, Bld: 116 mg/dL — ABNORMAL HIGH (ref 70–99)
Potassium: 4.8 mmol/L (ref 3.5–5.1)
Sodium: 138 mmol/L (ref 135–145)

## 2020-10-31 MED ORDER — KETOROLAC TROMETHAMINE 30 MG/ML IJ SOLN: 30.0000 mg | Freq: Once | INTRAMUSCULAR | Status: AC

## 2020-10-31 MED ORDER — SODIUM CHLORIDE 0.9 % IV BOLUS
1000.0000 mL | Freq: Once | INTRAVENOUS | Status: AC
Start: 2020-10-31 — End: 2020-10-31
  Administered 2020-10-31: 1000 mL via INTRAVENOUS

## 2020-10-31 MED ORDER — ONDANSETRON HCL 4 MG/2ML IJ SOLN
4.0000 mg | Freq: Once | INTRAMUSCULAR | Status: AC
  Administered 2020-10-31: 4 mg via INTRAVENOUS
  Filled 2020-10-31: qty 2

## 2020-10-31 MED ORDER — MORPHINE SULFATE (PF) 4 MG/ML IV SOLN
4.0000 mg | Freq: Once | INTRAVENOUS | Status: AC
  Administered 2020-10-31: 4 mg via INTRAVENOUS
  Filled 2020-10-31: qty 1

## 2020-10-31 MED ORDER — KETOROLAC TROMETHAMINE 30 MG/ML IJ SOLN
INTRAMUSCULAR | Status: AC
  Administered 2020-10-31: 30 mg via INTRAVENOUS
  Filled 2020-10-31: qty 1

## 2020-10-31 MED ORDER — TAMSULOSIN HCL 0.4 MG PO CAPS
0.4000 mg | ORAL_CAPSULE | Freq: Every day | ORAL | 0 refills | Status: DC
Start: 2020-10-31 — End: 2021-08-22

## 2020-10-31 MED ORDER — OXYCODONE-ACETAMINOPHEN 5-325 MG PO TABS: 1.0000 | ORAL_TABLET | ORAL | 0 refills | Status: DC | PRN

## 2020-10-31 NOTE — ED Triage Notes (Signed)
Pt c/o right flank pain for the past 10 days, today having N/V with hematuria with a hx of kidney stones.

## 2020-10-31 NOTE — Discharge Instructions (Signed)
Follow-up with Dr. Evelene Croon, please call for an appointment. Drink plenty of water Take the pain medication as needed, Flomax once daily Return emergency department if worsening Do not drive heavy machinery while taking the pain medication

## 2020-10-31 NOTE — ED Provider Notes (Signed)
[facility] Emergency Department Provider Note  ____________________________________________   Event Date/Time   First MD Initiated Contact with Patient 10/31/20 1055     (approximate)  I have reviewed the triage vital signs and the nursing notes.   HISTORY  Chief Complaint Flank Pain    HPI Edward Price is a 35 y.o. male presents emergency department complaining of right-sided flank pain.  Some nausea and vomiting along with hematuria.  History of kidney stones.  Patient still has gallbladder and appendix.  States he started having soreness in his back and then noticed today sharp pains in the abdomen with blood in the urine.  He denies fever chills.  He denies diarrhea.  Denies chest pain or shortness of breath    Past Medical History:  Diagnosis Date  . Morbid obesity (HCC)   . Nephrolithiasis     Patient Active Problem List   Diagnosis Date Noted  . Situational anxiety 05/02/2020  . 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 Morbid obesity (HCC)     History reviewed. No pertinent surgical history.  Prior to Admission medications   Medication Sig Start Date End Date Taking? Authorizing Provider  oxyCODONE-acetaminophen (PERCOCET) 5-325 MG tablet Take 1 tablet by mouth every 4 (four) hours as needed for severe pain. 10/31/20 10/31/21 Yes Fisher, Roselyn Bering, PA-C  tamsulosin (FLOMAX) 0.4 MG CAPS capsule Take 1 capsule (0.4 mg total) by mouth daily. 10/31/20  Yes Fisher, Roselyn Bering, PA-C  sertraline (ZOLOFT) 50 MG tablet Take 1 tablet (50 mg total) by mouth daily. 05/30/20   Dorcas Carrow, DO    Allergies Patient has no known allergies.  Family History  Problem Relation Age of Onset  . Diabetes Mother   . Hypertension Mother   . Diabetes Father   . Hypertension Father   . Heart disease Maternal Grandmother   . Hypertension Maternal Grandmother   . Cancer Maternal Grandfather        liver  . Heart disease Maternal  Grandfather   . Hypertension Maternal Grandfather   . Hypertension Paternal Grandmother   . Hypertension Paternal Grandfather   . Stroke Neg Hx   . COPD Neg Hx     Social History Social History   Tobacco Use  . Smoking status: Current Some Day Smoker    Types: Cigarettes  . Smokeless tobacco: Current User    Types: Chew  Substance Use Topics  . Alcohol use: No  . Drug use: No    Review of Systems  Constitutional: No fever/chills Eyes: No visual changes. ENT: No sore throat. Respiratory: Denies cough Cardiovascular: Denies chest pain Gastrointestinal: Positive abdominal pain Genitourinary: Negative for dysuria.  Positive hematuria Musculoskeletal: Negative for back pain. Skin: Negative for rash. Psychiatric: no mood changes,     ____________________________________________   PHYSICAL EXAM:  VITAL SIGNS: ED Triage Vitals [10/31/20 0927]  Enc Vitals Group     BP (!) 132/95     Pulse Rate 86     Resp 18     Temp 98.3 F (36.8 C)     Temp Source Oral     SpO2 100 %     Weight (!) 340 lb (154.2 kg)     Height 5\' 10"  (1.778 m)     Head Circumference      Peak Flow      Pain Score 9     Pain Loc      Pain Edu?  Excl. in GC?     Constitutional: Alert and oriented. Well appearing and in no acute distress. Eyes: Conjunctivae are normal.  Head: Atraumatic. Nose: No congestion/rhinnorhea. Mouth/Throat: Mucous membranes are moist.   Neck:  supple no lymphadenopathy noted Cardiovascular: Normal rate, regular rhythm. Heart sounds are normal Respiratory: Normal respiratory effort.  No retractions, lungs c t a  Abd: soft tender in the right flank area, bs normal all 4 quad GU: deferred Musculoskeletal: FROM all extremities, warm and well perfused Neurologic:  Normal speech and language.  Skin:  Skin is warm, dry and intact. No rash noted. Psychiatric: Mood and affect are normal. Speech and behavior are  normal.  ____________________________________________   LABS (all labs ordered are listed, but only abnormal results are displayed)  Labs Reviewed  URINALYSIS, COMPLETE (UACMP) WITH MICROSCOPIC - Abnormal; Notable for the following components:      Result Value   Color, Urine YELLOW (*)    APPearance CLOUDY (*)    Hgb urine dipstick LARGE (*)    Protein, ur 30 (*)    RBC / HPF >50 (*)    All other components within normal limits  BASIC METABOLIC PANEL - Abnormal; Notable for the following components:   Glucose, Bld 116 (*)    All other components within normal limits  CBC - Abnormal; Notable for the following components:   WBC 12.1 (*)    All other components within normal limits   ____________________________________________   ____________________________________________  RADIOLOGY  CT renal stone study  ____________________________________________   PROCEDURES  Procedure(s) performed: No  Procedures    ____________________________________________   INITIAL IMPRESSION / ASSESSMENT AND PLAN / ED COURSE  Pertinent labs & imaging results that were available during my care of the patient were reviewed by me and considered in my medical decision making (see chart for details).   Patient is 35 year old male presents with right-sided flank pain.  See HPI.  Physical exam shows patient be tender along the right flank.  Vitals are stable.  DDx: Acute cholecystitis, acute appendicitis, kidney stone, infected kidney stone, pyelonephritis  CBC is elevated WBC of 12.1, basic metabolic panel is normal, urinalysis large amount of hemoglobin, 30 protein and greater than 50 RBCs  CT renal stone study shows 2 mm partially obstructing stone.  CT does not show any gallstones and appendix is normal so this rules out acute cholecystitis and appendicitis.  Kidney stone is noted.  Feel that his symptoms are related to the kidney stone.  Patient was given normal saline 1 L IV,  morphine 4 mg IV, Zofran 4 mg IV, and toradol 30mg  iv   Patient had relief with medication.  Patient was given a strainer to use when urinating.  If he passes the stone he should keep this in provided for the urologist.  Return emergency department worsening.  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  Explained to him he cannot drive while taking Percocet.  States he understands.  He was discharged stable condition in care of his wife.  GRAESON NOURI was evaluated in Emergency Department on 10/31/2020 for the symptoms described in the history of present illness. He was evaluated in the context of the global COVID-19 pandemic, which necessitated consideration that the patient might be at risk for infection with the SARS-CoV-2 virus that causes COVID-19. Institutional protocols and algorithms that pertain to the evaluation of patients at risk for COVID-19 are in a state of rapid change based on information released by  regulatory bodies including the CDC and federal and state organizations. These policies and algorithms were followed during the patient's care in the ED.    As part of my medical decision making, I reviewed the following data within the electronic MEDICAL RECORD NUMBER History obtained from family, Nursing notes reviewed and incorporated, Labs reviewed , Old chart reviewed, Radiograph reviewed , Notes from prior ED visits and  Controlled Substance Database  ____________________________________________   FINAL CLINICAL IMPRESSION(S) / ED DIAGNOSES  Final diagnoses:  Kidney stone      NEW MEDICATIONS STARTED DURING THIS VISIT:  New Prescriptions   OXYCODONE-ACETAMINOPHEN (PERCOCET) 5-325 MG TABLET    Take 1 tablet by mouth every 4 (four) hours as needed for severe pain.   TAMSULOSIN (FLOMAX) 0.4 MG CAPS CAPSULE    Take 1 capsule (0.4 mg total) by mouth daily.     Note:  This document was prepared using Dragon voice recognition software and may include unintentional  dictation errors.    Faythe Ghee, PA-C 10/31/20 1202    Merwyn Katos, MD 10/31/20 [phone]

## 2020-11-01 ENCOUNTER — Emergency Department
Admission: EM | Admit: 2020-11-01 | Discharge: 2020-11-01 | Disposition: A | Discharge status: Home / Self Care | Payer: BC Managed Care – PPO | Attending: Emergency Medicine | Admitting: Emergency Medicine

## 2020-11-01 ENCOUNTER — Other Ambulatory Visit: Payer: Self-pay

## 2020-11-01 DIAGNOSIS — N200 Calculus of kidney: Secondary | ICD-10-CM | POA: Diagnosis not present

## 2020-11-01 DIAGNOSIS — R109 Unspecified abdominal pain: Secondary | ICD-10-CM | POA: Diagnosis not present

## 2020-11-01 DIAGNOSIS — F17210 Nicotine dependence, cigarettes, uncomplicated: Secondary | ICD-10-CM | POA: Diagnosis not present

## 2020-11-01 LAB — URINALYSIS, ROUTINE W REFLEX MICROSCOPIC
Bacteria, UA: NONE SEEN
Bilirubin Urine: NEGATIVE
Glucose, UA: NEGATIVE mg/dL
Ketones, ur: 5 mg/dL — AB
Leukocytes,Ua: NEGATIVE
Nitrite: NEGATIVE
Protein, ur: NEGATIVE mg/dL
Specific Gravity, Urine: 1.02 (ref 1.005–1.030)
pH: 5 (ref 5.0–8.0)

## 2020-11-01 LAB — COMPREHENSIVE METABOLIC PANEL
ALT: 27 U/L (ref 0–44)
AST: 15 U/L (ref 15–41)
Albumin: 4 g/dL (ref 3.5–5.0)
Alkaline Phosphatase: 92 U/L (ref 38–126)
Anion gap: 8 (ref 5–15)
BUN: 19 mg/dL (ref 6–20)
CO2: 25 mmol/L (ref 22–32)
Calcium: 9.2 mg/dL (ref 8.9–10.3)
Chloride: 102 mmol/L (ref 98–111)
Creatinine, Ser: 1.56 mg/dL — ABNORMAL HIGH (ref 0.61–1.24)
GFR, Estimated: 59 mL/min — ABNORMAL LOW (ref >60)
Glucose, Bld: 105 mg/dL — ABNORMAL HIGH (ref 70–99)
Potassium: 4.3 mmol/L (ref 3.5–5.1)
Sodium: 135 mmol/L (ref 135–145)
Total Bilirubin: 0.7 mg/dL (ref 0.3–1.2)
Total Protein: 7.2 g/dL (ref 6.5–8.1)

## 2020-11-01 LAB — CBC WITH DIFFERENTIAL/PLATELET
Abs Immature Granulocytes: 0.04 10*3/uL (ref 0.00–0.07)
Basophils Absolute: 0 10*3/uL (ref 0.0–0.1)
Basophils Relative: 0 %
Eosinophils Absolute: 0 10*3/uL (ref 0.0–0.5)
Eosinophils Relative: 0 %
HCT: 46.3 % (ref 39.0–52.0)
Hemoglobin: 15.2 g/dL (ref 13.0–17.0)
Immature Granulocytes: 0 %
Lymphocytes Relative: 14 %
Lymphs Abs: 2.2 10*3/uL (ref 0.7–4.0)
MCH: 29.5 pg (ref 26.0–34.0)
MCHC: 32.8 g/dL (ref 30.0–36.0)
MCV: 89.7 fL (ref 80.0–100.0)
Monocytes Absolute: 1 10*3/uL (ref 0.1–1.0)
Monocytes Relative: 6 %
Neutro Abs: 12.2 10*3/uL — ABNORMAL HIGH (ref 1.7–7.7)
Neutrophils Relative %: 80 %
Platelets: 297 10*3/uL (ref 150–400)
RBC: 5.16 MIL/uL (ref 4.22–5.81)
RDW: 13.3 % (ref 11.5–15.5)
WBC: 15.5 10*3/uL — ABNORMAL HIGH (ref 4.0–10.5)
nRBC: 0 % (ref 0.0–0.2)

## 2020-11-01 LAB — LIPASE, BLOOD: Lipase: 23 U/L (ref 11–51)

## 2020-11-01 MED ORDER — ONDANSETRON HCL 4 MG/2ML IJ SOLN
4.0000 mg | Freq: Once | INTRAMUSCULAR | Status: AC
  Administered 2020-11-01: 4 mg via INTRAVENOUS
  Filled 2020-11-01: qty 2

## 2020-11-01 MED ORDER — ONDANSETRON 4 MG PO TBDP: 4.0000 mg | ORAL_TABLET | Freq: Three times a day (TID) | ORAL | 0 refills | Status: AC | PRN

## 2020-11-01 MED ORDER — SODIUM CHLORIDE 0.9 % IV BOLUS
1000.0000 mL | Freq: Once | INTRAVENOUS | Status: AC
  Administered 2020-11-01: 1000 mL via INTRAVENOUS

## 2020-11-01 MED ORDER — HYDROMORPHONE HCL 1 MG/ML IJ SOLN
0.5000 mg | Freq: Once | INTRAMUSCULAR | Status: AC
  Administered 2020-11-01: 0.5 mg via INTRAVENOUS
  Filled 2020-11-01: qty 1

## 2020-11-01 MED ORDER — OXYCODONE HCL 5 MG PO TABS
5.0000 mg | ORAL_TABLET | Freq: Once | ORAL | Status: AC
  Administered 2020-11-01: 5 mg via ORAL
  Filled 2020-11-01: qty 1

## 2020-11-01 NOTE — Discharge Instructions (Addendum)
Take the Zofran 30 minutes to an hour before the oxycodone. Drink plenty of fluids. If develop fevers return to the ER immediately. You can call the number above to follow-up with urology if you're unable to pass the stone.

## 2020-11-01 NOTE — ED Notes (Signed)
ED Provider at bedside. 

## 2020-11-01 NOTE — ED Provider Notes (Signed)
[facility] Emergency Department Provider Note  ____________________________________________   Event Date/Time   First MD Initiated Contact with Patient 11/01/20 1937     (approximate)  I have reviewed the triage vital signs    HISTORY  Chief Complaint Back Pain    HPI Edward Price is a 35 y.o. male who presents with flank pain. history of kidney stones who comes in with kidney stone.  Patient was diagnosed with kidney stone yesterday.  He was prescribed oxycodone and tamsulosin.  Patient has any came in today due to the increasing pain on his right flank, severe, constant, nothing makes it better, nothing makes it worse.  He states that he started to take the oxycodone but he feels really sick on his stomach and has nausea and vomiting cannot keep it down.  He states that he has passed stones before and stones of this size so he just feels like if he can get his pain controlled he would be able to pass it.  Denies any fevers.   On review of records patient had a CT scan done yesterday that showed obstructing 2 mm stone at the right UVJ resulting in mild hydroureter ureter nephrosis and perinephric edema.      Past Medical History:  Diagnosis Date  . Morbid obesity (HCC)   . Nephrolithiasis     Patient Active Problem List   Diagnosis Date Noted  . Situational anxiety 05/02/2020  . 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 Morbid obesity (HCC)     No past surgical history on file.  Prior to Admission medications   Medication Sig Start Date End Date Taking? Authorizing Provider  oxyCODONE-acetaminophen (PERCOCET) 5-325 MG tablet Take 1 tablet by mouth every 4 (four) hours as needed for severe pain. 10/31/20 10/31/21  Fisher, Roselyn Bering, PA-C  sertraline (ZOLOFT) 50 MG tablet Take 1 tablet (50 mg total) by mouth daily. 05/30/20   Johnson, Megan P, DO  tamsulosin (FLOMAX) 0.4 MG CAPS capsule Take 1 capsule (0.4 mg total) by mouth  daily. 10/31/20   Faythe Ghee, PA-C    Allergies Patient has no known allergies.  Family History  Problem Relation Age of Onset  . Diabetes Mother   . Hypertension Mother   . Diabetes Father   . Hypertension Father   . Heart disease Maternal Grandmother   . Hypertension Maternal Grandmother   . Cancer Maternal Grandfather        liver  . Heart disease Maternal Grandfather   . Hypertension Maternal Grandfather   . Hypertension Paternal Grandmother   . Hypertension Paternal Grandfather   . Stroke Neg Hx   . COPD Neg Hx     Social History Social History   Tobacco Use  . Smoking status: Current Some Day Smoker    Types: Cigarettes  . Smokeless tobacco: Current User    Types: Chew  Substance Use Topics  . Alcohol use: No  . Drug use: No      Review of Systems Constitutional: No fever/chills Eyes: No visual changes. ENT: No sore throat. Cardiovascular: Denies chest pain. Respiratory: no SOB. No cough Gastrointestinal: No abdominal pain.  No nausea, no vomiting.  No diarrhea.  No constipation. Genitourinary: no severe blood in urine Musculoskeletal: + flank pain Skin: Negative for rash. Neurological: Negative for headaches, focal weakness or numbness. All other ROS negative ____________________________________________   PHYSICAL EXAM:  VITAL SIGNS: ED Triage Vitals  Enc Vitals Group  BP 11/01/20 1714 (!) 142/91     Pulse Rate 11/01/20 1714 94     Resp 11/01/20 1714 18     Temp 11/01/20 1714 98.2 F (36.8 C)     Temp Source 11/01/20 1714 Oral     SpO2 11/01/20 1714 100 %     Weight 11/01/20 1717 (!) 340 lb (154.2 kg)     Height 11/01/20 1717 5\' 10"  (1.778 m)     Head Circumference --      Peak Flow --      Pain Score 11/01/20 1716 10     Pain Loc --      Pain Edu? --      Excl. in GC? --     Constitutional: Alert and oriented. Discomfort noted  Eyes: Conjunctivae are normal. EOMI. Head: Atraumatic. Nose: No  congestion/rhinnorhea. Mouth/Throat: Mucous membranes are moist.   Neck: No stridor. Trachea Midline. FROM Cardiovascular: Normal rate, regular rhythm.  Good peripheral circulation. Respiratory: no audible stridor, work of breathing  Gastrointestinal: Soft and nontender. No distention.  Musculoskeletal: No lower extremity tenderness nor edema.  No joint effusions. Neurologic:  Normal speech and language. No gross focal neurologic deficits are appreciated.  Skin:  Skin is warm, dry and intact. No rash noted. Psychiatric: Mood and affect are normal. Speech and behavior are normal. GU: Deferred  Flank tenderness on the right ____________________________________________   LABS (all labs ordered are listed, but only abnormal results are displayed)  Labs Reviewed  COMPREHENSIVE METABOLIC PANEL - Abnormal; Notable for the following components:      Result Value   Glucose, Bld 105 (*)    Creatinine, Ser 1.56 (*)    GFR, Estimated 59 (*)    All other components within normal limits  CBC WITH DIFFERENTIAL/PLATELET - Abnormal; Notable for the following components:   WBC 15.5 (*)    Neutro Abs 12.2 (*)    All other components within normal limits  URINALYSIS, ROUTINE W REFLEX MICROSCOPIC - Abnormal; Notable for the following components:   Color, Urine YELLOW (*)    APPearance CLEAR (*)    Hgb urine dipstick MODERATE (*)    Ketones, ur 5 (*)    All other components within normal limits  LIPASE, BLOOD   ____________________________________________    PROCEDURES  Procedure(s) performed (including Critical Care):  Procedures   ____________________________________________   INITIAL IMPRESSION / ASSESSMENT AND PLAN / ED COURSE  [name] was evaluated in Emergency Department on 11/01/2020 for the symptoms described in the history of present illness. He was evaluated in the context of the global COVID-19 pandemic, which necessitated consideration that the patient might be at risk  for infection with the SARS-CoV-2 virus that causes COVID-19. Institutional protocols and algorithms that pertain to the evaluation of patients at risk for COVID-19 are in a state of rapid change based on information released by regulatory bodies including the CDC and federal and state organizations. These policies and algorithms were followed during the patient's care in the ED.     Pt presents with flank pain.  Suspect this is most likely kidney stone. Will get labs to evaluate for electrolyte abnormalities/AKI.  Does not a repeat CT scan given just had one yesterday.  No abdominal tenderness to suggest appendicitis, SBO.  Will get urine to evaluate for infected stone.  Patient was not sent home with any nausea pills.  I suspect that if we send him home with Zofran he will do better outpatient.  We will  give a dose of IV Zofran, IV fluid and IV Dilaudid and reassess  No evidence of infected stone. No fevers. UA re-assuring.   Reviewed labs--slight bump in his kidney function so we will give 1 L of fluid  9:06 PM reevaluated patient is doing much better. States the queasiness in his stomach is almost all gone. Given the pharmacies are now closed we'll give an additional dose of Zofran and oxycodone and p.o. challenge and if successful will discharge patient home. Patient noted to have some low-grade temperature 99.4 but again his urine looks very reassuring with normal white count and no nitrites or leuks. Again I have low suspicion for septic stone. However I did explain to patient return precautions related to his fever      ____________________________________________   FINAL CLINICAL IMPRESSION(S) / ED DIAGNOSES   Final diagnoses:  Kidney stone      MEDICATIONS GIVEN DURING THIS VISIT:  Medications  ondansetron (ZOFRAN) injection 4 mg (has no administration in time range)  oxyCODONE (Oxy IR/ROXICODONE) immediate release tablet 5 mg (has no administration in time range)  sodium  chloride 0.9 % bolus 1,000 mL (1,000 mLs Intravenous Bolus from Bag 11/01/20 1952)  ondansetron (ZOFRAN) injection 4 mg (4 mg Intravenous Given 11/01/20 1954)  HYDROmorphone (DILAUDID) injection 0.5 mg (0.5 mg Intravenous Given 11/01/20 1954)     ED Discharge Orders         Ordered    ondansetron (ZOFRAN ODT) 4 MG disintegrating tablet  Every 8 hours PRN        11/01/20 2107           Note:  This document was prepared using Dragon voice recognition software and may include unintentional dictation errors.   Concha Se, MD 11/01/20 2108

## 2020-11-01 NOTE — ED Notes (Signed)
Patient tolerating water without nausea or vomiting.

## 2020-11-01 NOTE — ED Triage Notes (Signed)
Pt states right lower back pain. Pt states that he was here yesterday for kidney stones, and the pain has gotten worse. Pt states he is not able to urinate either. Pt states unable to keep down pain medications at home.

## 2020-11-10 ENCOUNTER — Telehealth: Payer: Self-pay | Admitting: Urology

## 2020-11-10 NOTE — Telephone Encounter (Signed)
-----   Message from Riki Altes, MD sent at 11/10/2020  9:13 AM EST ----- Regarding: Appointment Recently seen in ED for stone.  Please schedule follow-up appointment with me.  Needs KUB prior

## 2020-11-10 NOTE — Telephone Encounter (Signed)
Patient declined to schedule passed stone  Stated he would call back if needed  [facility]

## 2020-11-25 ENCOUNTER — Other Ambulatory Visit: Payer: Self-pay

## 2020-11-25 ENCOUNTER — Ambulatory Visit (INDEPENDENT_AMBULATORY_CARE_PROVIDER_SITE_OTHER): Payer: BC Managed Care – PPO | Admitting: Family Medicine

## 2020-11-25 ENCOUNTER — Other Ambulatory Visit: Payer: Self-pay | Admitting: Family Medicine

## 2020-11-25 ENCOUNTER — Encounter: Payer: Self-pay | Admitting: Family Medicine

## 2020-11-25 VITALS — BP 132/85 | HR 96 | Ht 68.0 in | Wt 339.0 lb

## 2020-11-25 DIAGNOSIS — Z0000 Encounter for general adult medical examination without abnormal findings: Secondary | ICD-10-CM

## 2020-11-25 DIAGNOSIS — F418 Other specified anxiety disorders: Secondary | ICD-10-CM | POA: Diagnosis not present

## 2020-11-25 DIAGNOSIS — Z136 Encounter for screening for cardiovascular disorders: Secondary | ICD-10-CM | POA: Diagnosis not present

## 2020-11-25 LAB — URINALYSIS, ROUTINE W REFLEX MICROSCOPIC
Bilirubin, UA: NEGATIVE
Glucose, UA: NEGATIVE
Ketones, UA: NEGATIVE
Leukocytes,UA: NEGATIVE
Nitrite, UA: NEGATIVE
Protein,UA: NEGATIVE
RBC, UA: NEGATIVE
Specific Gravity, UA: 1.03 (ref 1.005–1.030)
Urobilinogen, Ur: 0.2 mg/dL (ref 0.2–1.0)
pH, UA: 5.5 (ref 5.0–7.5)

## 2020-11-25 MED ORDER — CONTRAVE 8-90 MG PO TB12: ORAL_TABLET | ORAL | 2 refills | Status: DC

## 2020-11-25 MED ORDER — ESCITALOPRAM OXALATE 5 MG PO TABS
5.0000 mg | ORAL_TABLET | Freq: Every day | ORAL | 1 refills | Status: DC
Start: 2020-11-25 — End: 2021-01-06

## 2020-11-25 NOTE — Telephone Encounter (Signed)
Requested medication (s) are due for refill today: Yes  Requested medication (s) are on the active medication list: Yes  Last refill:  11/25/20  Future visit scheduled: Yes  Notes to clinic:  See highlighted notes from pharmacy      Requested Prescriptions  Pending Prescriptions Disp Refills   CONTRAVE 8-90 MG TB12 [Pharmacy Med Name: CONTRAVE ER 8-90 MG TABLET] 120 tablet 2    Sig: Start 1 tablet every morning for 7 days, then 1 tablet twice daily for 7 days, then 2 tablets every morning and one every evening      Off-Protocol Failed - 11/25/2020  3:57 PM      Failed - Medication not assigned to a protocol, review manually.      Passed - Valid encounter within last 12 months    Recent Outpatient Visits           Today Routine general medical examination at a health care facility   [facility], [place] P, DO   2 months ago COVID-19   [facility], [place], DO   5 months ago Situational anxiety   [facility] [place], [place], DO   6 months ago Situational anxiety   [facility] [place], [place], DO   10 months ago Examination, physical, employee   [facility], Jodelle Gross, FNP       Future Appointments             In 1 month [facility] Benes, Oralia Rud, DO [employer], [facility]

## 2020-11-25 NOTE — Progress Notes (Signed)
BP 132/85   Pulse 96   Ht 5\' 8"  (1.727 m)   Wt (!) 339 lb (153.8 kg)   SpO2 98%   BMI 51.54 kg/m    Subjective:    Patient ID: Edward Price, male    DOB: 1985/11/30, 35 y.o.   MRN: [id number]  HPI: Edward JollyBryan G Denicola is a 35 y.o. male presenting on 11/25/2020 for comprehensive medical examination. Current medical complaints include:  DEPRESSION Mood status: better Satisfied with current treatment?: no Symptom severity: mild  Duration of current treatment : months Side effects: yes- inability to climax Medication compliance: excellent compliance Psychotherapy/counseling: no  Previous psychiatric medications: sertraline Depressed mood: no Anxious mood: no Anhedonia: no Significant weight loss or gain: yes Insomnia: no  Fatigue: no Feelings of worthlessness or guilt: no Impaired concentration/indecisiveness: no Suicidal ideations: no Hopelessness: no Crying spells: no [facility] 11/25/2020 09/23/2020 05/30/2020 05/02/2020 01/01/2020  Decreased Interest 0 0 0 0 0  Down, Depressed, Hopeless 0 0 0 0 0  PHQ - 2 Score 0 0 0 0 0  Altered sleeping 0 0 0 0 0  Tired, decreased energy 0 0 0 1 1  Change in appetite 0 0 0 3 3  Feeling bad or failure about yourself  0 0 0 0 0  Trouble concentrating 0 0 0 0 0  Moving slowly or fidgety/restless 0 0 0 0 1  Suicidal thoughts 0 0 0 0 0  PHQ-9 Score 0 0 0 4 5  Difficult doing work/chores - Not difficult at all - - -  Some recent data might be hidden   WEIGHT GAIN Duration: chronic Previous attempts at weight loss: yes, diet, exercise, weight watchers, belviq, contrave Complications of obesity: none Peak weight: 351 Weight loss goal: 250  Weight loss to date: 12 lbs Requesting obesity pharmacotherapy: yes Current weight loss supplements/medications: no Previous weight loss supplements/meds: no  He currently lives with: wife and surgery Interim Problems from his last visit: yes- kidney stone  Depression Screen done  today and results listed below:  Depression screen [facility] 2/[facility] 11/25/2020 09/23/2020 05/30/2020 05/02/2020 01/01/2020  Decreased Interest 0 0 0 0 0  Down, Depressed, Hopeless 0 0 0 0 0  PHQ - 2 Score 0 0 0 0 0  Altered sleeping 0 0 0 0 0  Tired, decreased energy 0 0 0 1 1  Change in appetite 0 0 0 3 3  Feeling bad or failure about yourself  0 0 0 0 0  Trouble concentrating 0 0 0 0 0  Moving slowly or fidgety/restless 0 0 0 0 1  Suicidal thoughts 0 0 0 0 0  PHQ-9 Score 0 0 0 4 5  Difficult doing work/chores - Not difficult at all - - -  Some recent data might be hidden    Past Medical History:  Past Medical History:  Diagnosis Date  . Morbid obesity (HCC)   . Nephrolithiasis     Surgical History:  History reviewed. No pertinent surgical history.  Medications:  Current Outpatient Medications on File Prior to Visit  Medication Sig  . ondansetron (ZOFRAN ODT) 4 MG disintegrating tablet Take 1 tablet (4 mg total) by mouth every 8 (eight) hours as needed for nausea or vomiting. (Patient not taking: Reported on 11/25/2020)  . tamsulosin (FLOMAX) 0.4 MG CAPS capsule Take 1 capsule (0.4 mg total) by mouth daily. (Patient not taking: Reported on 11/25/2020)   No current facility-administered medications on file prior to visit.  Allergies:  Allergies  Allergen Reactions  . Percocet [Oxycodone-Acetaminophen] Nausea And Vomiting    Social History:  Social History   Socioeconomic History  . Marital status: Married    Spouse name: Not on file  . Number of children: Not on file  . Years of education: Not on file  . Highest education level: Not on file  Occupational History  . Not on file  Tobacco Use  . Smoking status: Current Some Day Smoker    Types: Cigarettes  . Smokeless tobacco: Current User    Types: Chew  Vaping Use  . Vaping Use: Never used  Substance and Sexual Activity  . Alcohol use: No  . Drug use: No  . Sexual activity: Yes  Other Topics Concern  . Not on file   Social History Narrative  . Not on file   Social Determinants of Health   Financial Resource Strain: Not on file  Food Insecurity: Not on file  Transportation Needs: Not on file  Physical Activity: Not on file  Stress: Not on file  Social Connections: Not on file  Intimate Partner Violence: Not on file   Social History   Tobacco Use  Smoking Status Current Some Day Smoker  . Types: Cigarettes  Smokeless Tobacco Current User  . Types: Chew   Social History   Substance and Sexual Activity  Alcohol Use No    Family History:  Family History  Problem Relation Age of Onset  . Diabetes Mother   . Hypertension Mother   . Diabetes Father   . Hypertension Father   . Heart disease Maternal Grandmother   . Hypertension Maternal Grandmother   . Cancer Maternal Grandfather        liver  . Heart disease Maternal Grandfather   . Hypertension Maternal Grandfather   . Hypertension Paternal Grandmother   . Hypertension Paternal Grandfather   . Stroke Neg Hx   . COPD Neg Hx     Past medical history, surgical history, medications, allergies, family history and social history reviewed with patient today and changes made to appropriate areas of the chart.   Review of Systems  Constitutional: Negative.   HENT: Negative.   Eyes: Negative.   Respiratory: Negative.   Cardiovascular: Negative.   Gastrointestinal: Positive for heartburn (with food choices). Negative for abdominal pain, blood in stool, constipation, diarrhea, melena, nausea and vomiting.  Genitourinary: Negative.   Musculoskeletal: Negative.   Skin: Negative.   Neurological: Negative.   Endo/Heme/Allergies: Positive for environmental allergies. Negative for polydipsia. Does not bruise/bleed easily.  Psychiatric/Behavioral: Negative.    All other ROS negative except what is listed above and in the HPI.      Objective:    BP 132/85   Pulse 96   Ht  (1.727 m)   Wt (!) 339 lb (153.8 kg)   SpO2 98%   BMI  51.54 kg/m   Wt Readings from Last 3 Encounters:  11/25/20 (!) 339 lb (153.8 kg)  11/01/20 (!) 340 lb (154.2 kg)  10/31/20 (!) 340 lb (154.2 kg)    Physical Exam Vitals and nursing note reviewed.  Constitutional:      General: He is not in acute distress.    Appearance: Normal appearance. He is obese. He is not ill-appearing, toxic-appearing or diaphoretic.  HENT:     Head: Normocephalic and atraumatic.     Right Ear: External ear normal.     Left Ear: External ear normal.     Nose: Nose normal.  Mouth/Throat:     Mouth: Mucous membranes are moist.     Pharynx: Oropharynx is clear.  Eyes:     General: No scleral icterus.       Right eye: No discharge.        Left eye: No discharge.     Extraocular Movements: Extraocular movements intact.     Conjunctiva/sclera: Conjunctivae normal.     Pupils: Pupils are equal, round, and reactive to light.  Cardiovascular:     Rate and Rhythm: Normal rate and regular rhythm.     Pulses: Normal pulses.     Heart sounds: Normal heart sounds. No murmur heard. No friction rub. No gallop.   Pulmonary:     Effort: Pulmonary effort is normal. No respiratory distress.     Breath sounds: Normal breath sounds. No stridor. No wheezing, rhonchi or rales.  Chest:     Chest wall: No tenderness.  Musculoskeletal:        General: Normal range of motion.     Cervical back: Normal range of motion and neck supple.  Skin:    General: Skin is warm and dry.     Capillary Refill: Capillary refill takes less than 2 seconds.     Coloration: Skin is not jaundiced or pale.     Findings: No bruising, erythema, lesion or rash.  Neurological:     General: No focal deficit present.     Mental Status: He is alert and oriented to person, place, and time. Mental status is at baseline.  Psychiatric:        Mood and Affect: Mood normal.        Behavior: Behavior normal.        Thought Content: Thought content normal.        Judgment: Judgment normal.      Results for orders placed or performed during the hospital encounter of 11/01/20  Comprehensive metabolic panel  Result Value Ref Range   Sodium 135 135 - 145 mmol/L   Potassium 4.3 3.5 - 5.1 mmol/L   Chloride 102 98 - 111 mmol/L   CO2 25 22 - 32 mmol/L   Glucose, Bld 105 (H) 70 - 99 mg/dL   BUN 19 6 - 20 mg/dL   Creatinine, Ser 3.53 (H) 0.61 - 1.24 mg/dL   Calcium 9.2 8.9 - 29.9 mg/dL   Total Protein 7.2 6.5 - 8.1 g/dL   Albumin 4.0 3.5 - 5.0 g/dL   AST 15 15 - 41 U/L   ALT 27 0 - 44 U/L   Alkaline Phosphatase 92 38 - 126 U/L   Total Bilirubin 0.7 0.3 - 1.2 mg/dL   GFR, Estimated 59 (L) >60 mL/min   Anion gap 8 5 - 15  Lipase, blood  Result Value Ref Range   Lipase 23 11 - 51 U/L  CBC with Diff  Result Value Ref Range   WBC 15.5 (H) 4.0 - 10.5 K/uL   RBC 5.16 4.22 - 5.81 MIL/uL   Hemoglobin 15.2 13.0 - 17.0 g/dL   HCT 24.2 68.3 - 41.9 %   MCV 89.7 80.0 - 100.0 fL   MCH 29.5 26.0 - 34.0 pg   MCHC 32.8 30.0 - 36.0 g/dL   RDW 62.2 29.7 - 98.9 %   Platelets 297 150 - 400 K/uL   nRBC 0.0 0.0 - 0.2 %   Neutrophils Relative % 80 %   Neutro Abs 12.2 (H) 1.7 - 7.7 K/uL   Lymphocytes Relative 14 %  Lymphs Abs 2.2 0.7 - 4.0 K/uL   Monocytes Relative 6 %   Monocytes Absolute 1.0 0.1 - 1.0 K/uL   Eosinophils Relative 0 %   Eosinophils Absolute 0.0 0.0 - 0.5 K/uL   Basophils Relative 0 %   Basophils Absolute 0.0 0.0 - 0.1 K/uL   Immature Granulocytes 0 %   Abs Immature Granulocytes 0.04 0.00 - 0.07 K/uL  Urinalysis, Routine w reflex microscopic  Result Value Ref Range   Color, Urine YELLOW (A) YELLOW   APPearance CLEAR (A) CLEAR   Specific Gravity, Urine 1.020 1.005 - 1.030   pH 5.0 5.0 - 8.0   Glucose, UA NEGATIVE NEGATIVE mg/dL   Hgb urine dipstick MODERATE (A) NEGATIVE   Bilirubin Urine NEGATIVE NEGATIVE   Ketones, ur 5 (A) NEGATIVE mg/dL   Protein, ur NEGATIVE NEGATIVE mg/dL   Nitrite NEGATIVE NEGATIVE   Leukocytes,Ua NEGATIVE NEGATIVE   RBC / HPF 11-20 0 -  5 RBC/hpf   WBC, UA 0-5 0 - 5 WBC/hpf   Bacteria, UA NONE SEEN NONE SEEN   Squamous Epithelial / LPF 0-5 0 - 5   Mucus PRESENT       Assessment & Plan:   Problem List Items Addressed This Visit      Other   Morbid obesity (HCC)    Did great on contrave, but wasn't covered- wants to lose weight with his wife, wants to try it again even if not covered. Rx sent to his pharmacy. Recheck 4-6 weeks.       Situational anxiety    Doing great on the sertraline but inability to climax. We will change to lexapro and recheck 4-6 weeks. Call with any concerns.        Other Visit Diagnoses    Routine general medical examination at a health care facility    -  Primary   Vaccines up to date/declined. Screening labs checked today. Continue diet and exercise. Call with any concenrs.    Relevant Orders   Comprehensive metabolic panel   CBC with Differential/Platelet   Lipid Panel w/o Chol/HDL Ratio   TSH   Urinalysis, Routine w reflex microscopic   Hepatitis C Antibody   HIV Antibody (routine testing w rflx)       LABORATORY TESTING:  Health maintenance labs ordered today as discussed above.   IMMUNIZATIONS:   - Tdap: Tetanus vaccination status reviewed: last tetanus booster within 10 years. - Influenza: Postponed to flu season - Pneumovax: declined - Prevnar: Not applicable   PATIENT COUNSELING:    Sexuality: Discussed sexually transmitted diseases, partner selection, use of condoms, avoidance of unintended pregnancy  and contraceptive alternatives.   Advised to avoid cigarette smoking.  I discussed with the patient that most people either abstain from alcohol or drink within safe limits (<=14/week and <=4 drinks/occasion for males, <=7/weeks and <= 3 drinks/occasion for females) and that the risk for alcohol disorders and other health effects rises proportionally with the number of drinks per week and how often a drinker exceeds daily limits.  Discussed cessation/primary  prevention of drug use and availability of treatment for abuse.   Diet: Encouraged to adjust caloric intake to maintain  or achieve ideal body weight, to reduce intake of dietary saturated fat and total fat, to limit sodium intake by avoiding high sodium foods and not adding table salt, and to maintain adequate dietary potassium and calcium preferably from fresh fruits, vegetables, and low-fat dairy products.    stressed the importance of regular exercise  Injury prevention: Discussed safety belts, safety helmets, smoke detector, smoking near bedding or upholstery.   Dental health: Discussed importance of regular tooth brushing, flossing, and dental visits.   Follow up plan: NEXT PREVENTATIVE PHYSICAL DUE IN 1 YEAR. Return in about 6 weeks (around 01/06/2021).

## 2020-11-25 NOTE — Assessment & Plan Note (Signed)
Did great on contrave, but wasn't covered- wants to lose weight with his wife, wants to try it again even if not covered. Rx sent to his pharmacy. Recheck 4-6 weeks.

## 2020-11-25 NOTE — Assessment & Plan Note (Signed)
Doing great on the sertraline but inability to climax. We will change to lexapro and recheck 4-6 weeks. Call with any concerns.

## 2020-11-26 LAB — LIPID PANEL W/O CHOL/HDL RATIO
Cholesterol, Total: 190 mg/dL (ref 100–199)
HDL: 43 mg/dL (ref >39)
LDL Chol Calc (NIH): 117 mg/dL — ABNORMAL HIGH (ref 0–99)
Triglycerides: 168 mg/dL — ABNORMAL HIGH (ref 0–149)
VLDL Cholesterol Cal: 30 mg/dL (ref 5–40)

## 2020-11-26 LAB — CBC WITH DIFFERENTIAL/PLATELET
Basophils Absolute: 0 10*3/uL (ref 0.0–0.2)
Basos: 0 %
EOS (ABSOLUTE): 0.1 10*3/uL (ref 0.0–0.4)
Eos: 1 %
Hematocrit: 46.3 % (ref 37.5–51.0)
Hemoglobin: 15.5 g/dL (ref 13.0–17.7)
Immature Grans (Abs): 0 10*3/uL (ref 0.0–0.1)
Immature Granulocytes: 0 %
Lymphocytes Absolute: 3.6 10*3/uL — ABNORMAL HIGH (ref 0.7–3.1)
Lymphs: 37 %
MCH: 30 pg (ref 26.6–33.0)
MCHC: 33.5 g/dL (ref 31.5–35.7)
MCV: 90 fL (ref 79–97)
Monocytes Absolute: 0.6 10*3/uL (ref 0.1–0.9)
Monocytes: 6 %
Neutrophils Absolute: 5.3 10*3/uL (ref 1.4–7.0)
Neutrophils: 56 %
Platelets: 297 10*3/uL (ref 150–450)
RBC: 5.16 x10E6/uL (ref 4.14–5.80)
RDW: 13.6 % (ref 11.6–15.4)
WBC: 9.7 10*3/uL (ref 3.4–10.8)

## 2020-11-26 LAB — COMPREHENSIVE METABOLIC PANEL
ALT: 27 IU/L (ref 0–44)
AST: 22 IU/L (ref 0–40)
Albumin/Globulin Ratio: 1.5 (ref 1.2–2.2)
Albumin: 4.3 g/dL (ref 4.0–5.0)
Alkaline Phosphatase: 114 IU/L (ref 44–121)
BUN/Creatinine Ratio: 9 (ref 9–20)
BUN: 9 mg/dL (ref 6–20)
Bilirubin Total: <0.2 mg/dL (ref 0.0–1.2)
CO2: 20 mmol/L (ref 20–29)
Calcium: 9.5 mg/dL (ref 8.7–10.2)
Chloride: 104 mmol/L (ref 96–106)
Creatinine, Ser: 1.03 mg/dL (ref 0.76–1.27)
Globulin, Total: 2.9 g/dL (ref 1.5–4.5)
Glucose: 94 mg/dL (ref 65–99)
Potassium: 4.3 mmol/L (ref 3.5–5.2)
Sodium: 142 mmol/L (ref 134–144)
Total Protein: 7.2 g/dL (ref 6.0–8.5)
eGFR: 97 mL/min/{1.73_m2} (ref >59)

## 2020-11-26 LAB — HEPATITIS C ANTIBODY: Hep C Virus Ab: <0.1 s/co ratio (ref 0.0–0.9)

## 2020-11-26 LAB — TSH: TSH: 1.45 u[IU]/mL (ref 0.450–4.500)

## 2020-11-26 LAB — HIV ANTIBODY (ROUTINE TESTING W REFLEX): HIV Screen 4th Generation wRfx: NONREACTIVE

## 2020-11-28 NOTE — Telephone Encounter (Signed)
Please let pharmacy know that patient is aware and plans to use a coupon

## 2020-12-14 ENCOUNTER — Encounter: Payer: Self-pay | Admitting: Family Medicine

## 2021-01-06 ENCOUNTER — Other Ambulatory Visit: Payer: Self-pay

## 2021-01-06 ENCOUNTER — Encounter: Payer: Self-pay | Admitting: Family Medicine

## 2021-01-06 ENCOUNTER — Ambulatory Visit (INDEPENDENT_AMBULATORY_CARE_PROVIDER_SITE_OTHER): Payer: BC Managed Care – PPO | Admitting: Family Medicine

## 2021-01-06 DIAGNOSIS — F418 Other specified anxiety disorders: Secondary | ICD-10-CM | POA: Diagnosis not present

## 2021-01-06 MED ORDER — CONTRAVE 8-90 MG PO TB12: 2.0000 | ORAL_TABLET | Freq: Two times a day (BID) | ORAL | 1 refills | Status: DC

## 2021-01-06 MED ORDER — ESCITALOPRAM OXALATE 10 MG PO TABS: 10.0000 mg | ORAL_TABLET | Freq: Every day | ORAL | 1 refills | Status: DC

## 2021-01-06 NOTE — Assessment & Plan Note (Signed)
Will increase his lexapro to 10mg . Call with any concerns. Recheck 6 months if doing well- otherwise sooner.

## 2021-01-06 NOTE — Progress Notes (Signed)
BP 116/77   Pulse 79   Wt (!) 331 lb 9.6 oz (150.4 kg)   SpO2 95%   BMI 50.42 kg/m    Subjective:    Patient ID: Edward Price, male    DOB: 06/05/1986, 35 y.o.   MRN: [id number]  HPI: Edward Price is a 36 y.o. male  Chief Complaint  Patient presents with  . Obesity  . Anxiety   Obesity Duration: chronic  Previous attempts at weight loss: yes Complications of obesity: none Peak weight: 351 Weight loss goal: 250 Weight loss to date: 20lbs! Requesting obesity pharmacotherapy: yes Current weight loss supplements/medications: yes Previous weight loss supplements/meds: yes  ANXIETY/STRESS Duration: chronic Status:uncontrolled Anxious mood: yes  Excessive worrying: yes Irritability: yes  Sweating: no Nausea: no Palpitations:no Hyperventilation: no Panic attacks: no Agoraphobia: no  Obscessions/compulsions: no Depressed mood: no Depression screen [facility] 2/9 11/25/2020 09/23/2020 05/30/2020 05/02/2020 01/01/2020  Decreased Interest 0 0 0 0 0  Down, Depressed, Hopeless 0 0 0 0 0  PHQ - 2 Score 0 0 0 0 0  Altered sleeping 0 0 0 0 0  Tired, decreased energy 0 0 0 1 1  Change in appetite 0 0 0 3 3  Feeling bad or failure about yourself  0 0 0 0 0  Trouble concentrating 0 0 0 0 0  Moving slowly or fidgety/restless 0 0 0 0 1  Suicidal thoughts 0 0 0 0 0  PHQ-9 Score 0 0 0 4 5  Difficult doing work/chores - Not difficult at all - - -  Some recent data might be hidden   GAD 7 : Generalized Anxiety Score 01/06/2021 05/30/2020 05/02/2020 06/10/2018  Nervous, Anxious, on Edge 0 0 1 0  Control/stop worrying 0 0 3 1  Worry too much - different things 3 0 3 1  Trouble relaxing 0 0 0 0  Restless 0 0 0 0  Easily annoyed or irritable 3 0 1 1  Afraid - awful might happen 0 0 3 0  Total GAD 7 Score 6 0 11 3  Anxiety Difficulty Not difficult at all - - Not difficult at all   Anhedonia: no Weight changes: yes Insomnia: no   Hypersomnia: no Fatigue/loss of energy: yes Feelings of  worthlessness: no Feelings of guilt: no Impaired concentration/indecisiveness: no Suicidal ideations: no  Crying spells: no Recent Stressors/Life Changes: yes   Relationship problems: no   Family stress: no     Financial stress: no    Job stress: yes    Recent death/loss: no   Relevant past medical, surgical, family and social history reviewed and updated as indicated. Interim medical history since our last visit reviewed. Allergies and medications reviewed and updated.  Review of Systems  Constitutional: Negative.   Respiratory: Negative.   Cardiovascular: Negative.   Gastrointestinal: Negative.   Musculoskeletal: Negative.   Skin: Negative.   Psychiatric/Behavioral: Negative for agitation, behavioral problems, confusion, decreased concentration, dysphoric mood, hallucinations, self-injury, sleep disturbance and suicidal ideas. The patient is nervous/anxious. The patient is not hyperactive.     Per HPI unless specifically indicated above     Objective:    BP 116/77   Pulse 79   Wt (!) 331 lb 9.6 oz (150.4 kg)   SpO2 95%   BMI 50.42 kg/m   Wt Readings from Last 3 Encounters:  01/06/21 (!) 331 lb 9.6 oz (150.4 kg)  11/25/20 (!) 339 lb (153.8 kg)  11/01/20 (!) 340 lb (154.2 kg)  Physical Exam Vitals and nursing note reviewed.  Constitutional:      General: He is not in acute distress.    Appearance: Normal appearance. He is obese. He is not ill-appearing, toxic-appearing or diaphoretic.  HENT:     Head: Normocephalic and atraumatic.     Right Ear: External ear normal.     Left Ear: External ear normal.     Nose: Nose normal.     Mouth/Throat:     Mouth: Mucous membranes are moist.     Pharynx: Oropharynx is clear.  Eyes:     General: No scleral icterus.       Right eye: No discharge.        Left eye: No discharge.     Extraocular Movements: Extraocular movements intact.     Conjunctiva/sclera: Conjunctivae normal.     Pupils: Pupils are equal, round, and  reactive to light.  Cardiovascular:     Rate and Rhythm: Normal rate and regular rhythm.     Pulses: Normal pulses.     Heart sounds: Normal heart sounds. No murmur heard. No friction rub. No gallop.   Pulmonary:     Effort: Pulmonary effort is normal. No respiratory distress.     Breath sounds: Normal breath sounds. No stridor. No wheezing, rhonchi or rales.  Chest:     Chest wall: No tenderness.  Musculoskeletal:        General: Normal range of motion.     Cervical back: Normal range of motion and neck supple.  Skin:    General: Skin is warm and dry.     Capillary Refill: Capillary refill takes less than 2 seconds.     Coloration: Skin is not jaundiced or pale.     Findings: No bruising, erythema, lesion or rash.  Neurological:     General: No focal deficit present.     Mental Status: He is alert and oriented to person, place, and time. Mental status is at baseline.  Psychiatric:        Mood and Affect: Mood normal.        Behavior: Behavior normal.        Thought Content: Thought content normal.        Judgment: Judgment normal.     Results for orders placed or performed in visit on 11/25/20  Comprehensive metabolic panel  Result Value Ref Range   Glucose 94 65 - 99 mg/dL   BUN 9 6 - 20 mg/dL   Creatinine, Ser 1.03 0.76 - 1.27 mg/dL   eGFR 97 >59 mL/min/1.73   BUN/Creatinine Ratio 9 9 - 20   Sodium 142 134 - 144 mmol/L   Potassium 4.3 3.5 - 5.2 mmol/L   Chloride 104 96 - 106 mmol/L   CO2 20 20 - 29 mmol/L   Calcium 9.5 8.7 - 10.2 mg/dL   Total Protein 7.2 6.0 - 8.5 g/dL   Albumin 4.3 4.0 - 5.0 g/dL   Globulin, Total 2.9 1.5 - 4.5 g/dL   Albumin/Globulin Ratio 1.5 1.2 - 2.2   Bilirubin Total <0.2 0.0 - 1.2 mg/dL   Alkaline Phosphatase 114 44 - 121 IU/L   AST 22 0 - 40 IU/L   ALT 27 0 - 44 IU/L  CBC with Differential/Platelet  Result Value Ref Range   WBC 9.7 3.4 - 10.8 x10E3/uL   RBC 5.16 4.14 - 5.80 x10E6/uL   Hemoglobin 15.5 13.0 - 17.7 g/dL   Hematocrit  46.3 37.5 - 51.0 %   MCV  90 79 - 97 fL   MCH 30.0 26.6 - 33.0 pg   MCHC 33.5 31.5 - 35.7 g/dL   RDW 13.6 11.6 - 15.4 %   Platelets 297 150 - 450 x10E3/uL   Neutrophils 56 Not Estab. %   Lymphs 37 Not Estab. %   Monocytes 6 Not Estab. %   Eos 1 Not Estab. %   Basos 0 Not Estab. %   Neutrophils Absolute 5.3 1.4 - 7.0 x10E3/uL   Lymphocytes Absolute 3.6 (H) 0.7 - 3.1 x10E3/uL   Monocytes Absolute 0.6 0.1 - 0.9 x10E3/uL   EOS (ABSOLUTE) 0.1 0.0 - 0.4 x10E3/uL   Basophils Absolute 0.0 0.0 - 0.2 x10E3/uL   Immature Granulocytes 0 Not Estab. %   Immature Grans (Abs) 0.0 0.0 - 0.1 x10E3/uL  Lipid Panel w/o Chol/HDL Ratio  Result Value Ref Range   Cholesterol, Total 190 100 - 199 mg/dL   Triglycerides 168 (H) 0 - 149 mg/dL   HDL 43 >39 mg/dL   VLDL Cholesterol Cal 30 5 - 40 mg/dL   LDL Chol Calc (NIH) 117 (H) 0 - 99 mg/dL  TSH  Result Value Ref Range   TSH 1.450 0.450 - 4.500 uIU/mL  Urinalysis, Routine w reflex microscopic  Result Value Ref Range   Specific Gravity, UA 1.030 1.005 - 1.030   pH, UA 5.5 5.0 - 7.5   Color, UA Yellow Yellow   Appearance Ur Clear Clear   Leukocytes,UA Negative Negative   Protein,UA Negative Negative/Trace   Glucose, UA Negative Negative   Ketones, UA Negative Negative   RBC, UA Negative Negative   Bilirubin, UA Negative Negative   Urobilinogen, Ur 0.2 0.2 - 1.0 mg/dL   Nitrite, UA Negative Negative  Hepatitis C Antibody  Result Value Ref Range   Hep C Virus Ab <0.1 0.0 - 0.9 s/co ratio  HIV Antibody (routine testing w rflx)  Result Value Ref Range   HIV Screen 4th Generation wRfx Non Reactive Non Reactive      Assessment & Plan:   Problem List Items Addressed This Visit      Other   Morbid obesity ([place])    Congratulated patient on 20lb weight loss! 8 in the last month! Tolerating contrave well. Continue contrave. Call with any concerns. Recheck 6 months.       Relevant Medications   Naltrexone-buPROPion HCl ER (CONTRAVE) 8-90 MG TB12    Situational anxiety    Will increase his lexapro to 63m. Call with any concerns. Recheck 6 months if doing well- otherwise sooner.       Relevant Medications   escitalopram (LEXAPRO) 10 MG tablet       Follow up plan: Return in about 6 months (around 07/09/2021).

## 2021-01-06 NOTE — Assessment & Plan Note (Signed)
Congratulated patient on 20lb weight loss! 8 in the last month! Tolerating contrave well. Continue contrave. Call with any concerns. Recheck 6 months.

## 2021-01-09 ENCOUNTER — Telehealth: Payer: Self-pay

## 2021-01-09 NOTE — Telephone Encounter (Signed)
PA for Contrave initiated and submitted via Cover My Meds. Key: [id number]

## 2021-01-10 NOTE — Telephone Encounter (Signed)
PA approved. Notified patient via mychart message.  

## 2021-02-17 ENCOUNTER — Ambulatory Visit: Payer: BC Managed Care – PPO | Admitting: Family Medicine

## 2021-07-17 IMAGING — CT CT RENAL STONE PROTOCOL
2 of 4 series · 16 of 46 positions shown, 18 images · non-contrast
Comparison: None.

CLINICAL DATA: Right flank pain

EXAM:
CT ABDOMEN AND PELVIS WITHOUT CONTRAST
TECHNIQUE: Multidetector CT imaging of the abdomen and pelvis was performed
following the standard protocol without IV contrast.

[Series 2: stone full standard · axial · 0.80mm/px · z∈[-652,-182]mm · 13 of 104 slices shown, 15 images]
[im 5/104  soft-tissue]
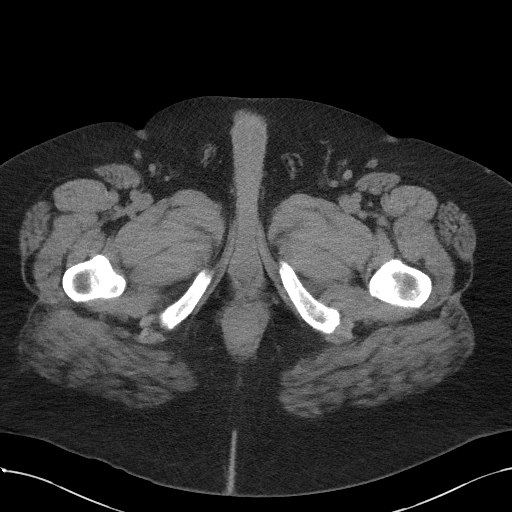
[im 5/104  bone]
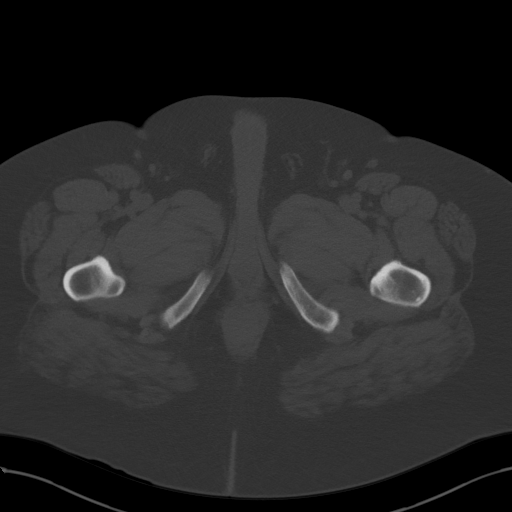
[im 14/104  soft-tissue]
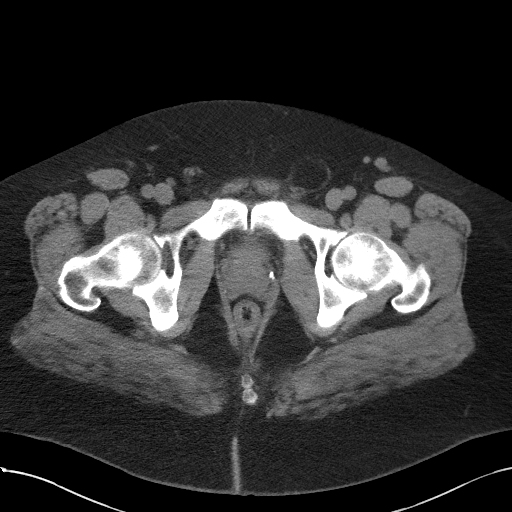
[im 23/104  soft-tissue]
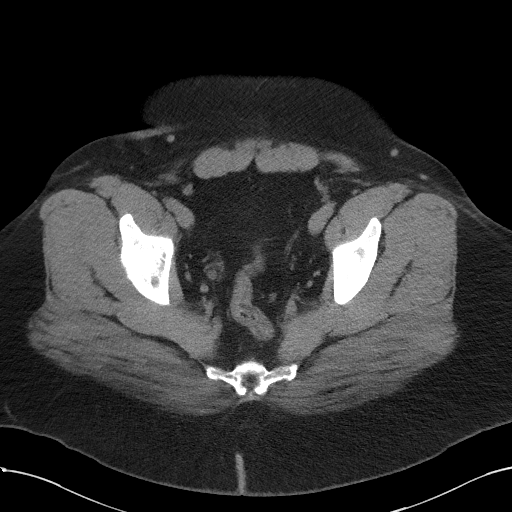
[im 27/104  soft-tissue]
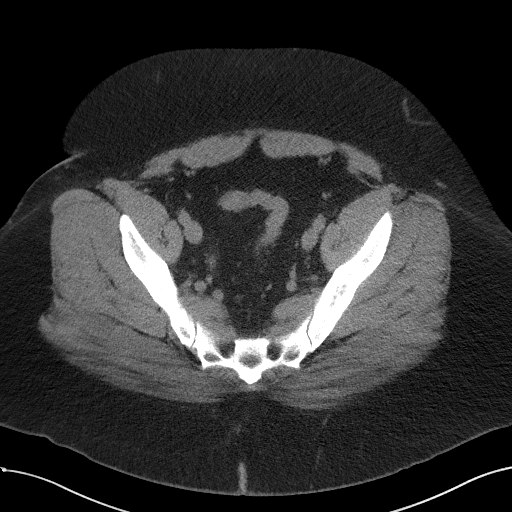
[im 36/104  soft-tissue]
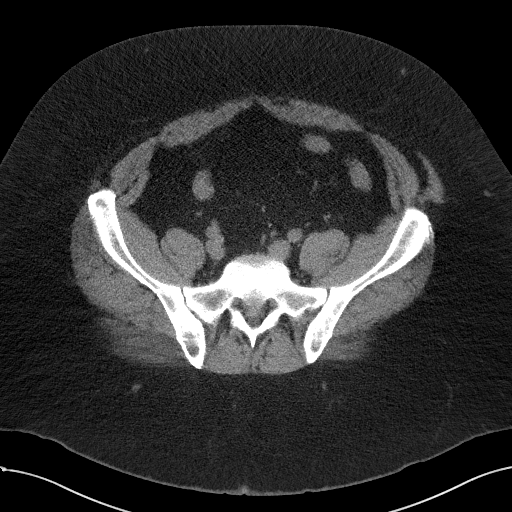
[im 45/104  soft-tissue]
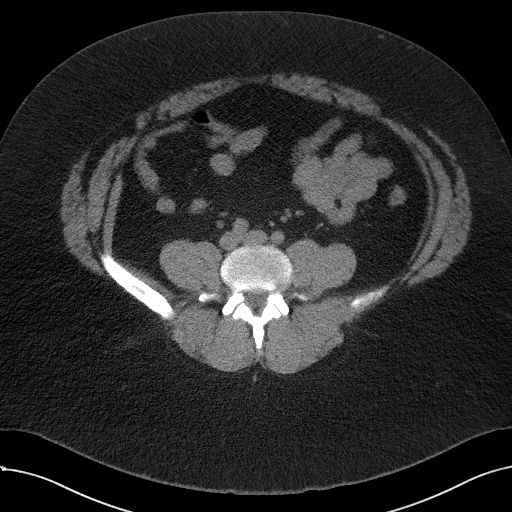
[im 54/104  soft-tissue]
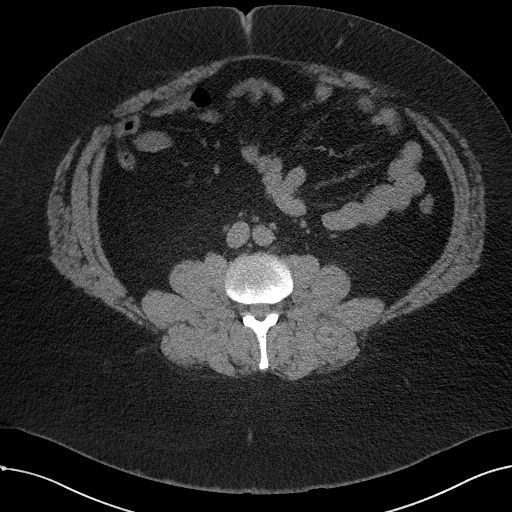
[im 59/104  soft-tissue]
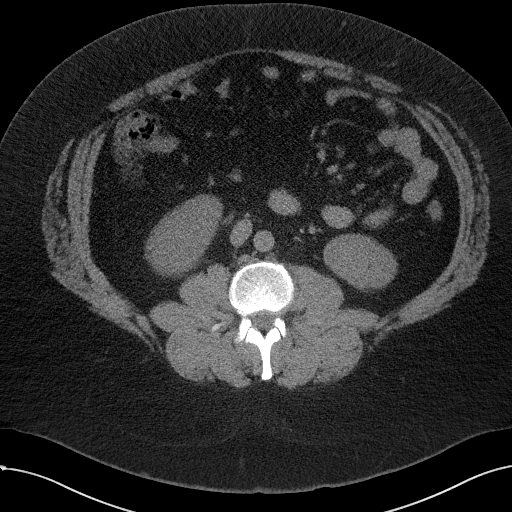
[im 68/104  soft-tissue]
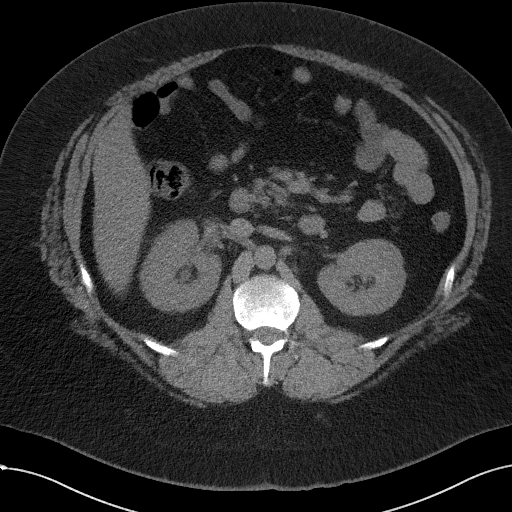
[im 68/104  bone]
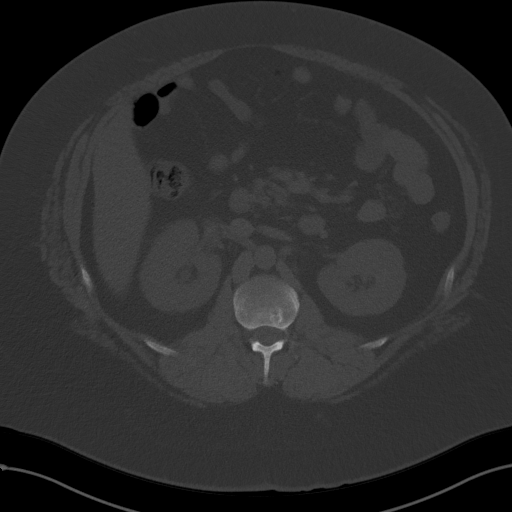
[im 77/104  soft-tissue]
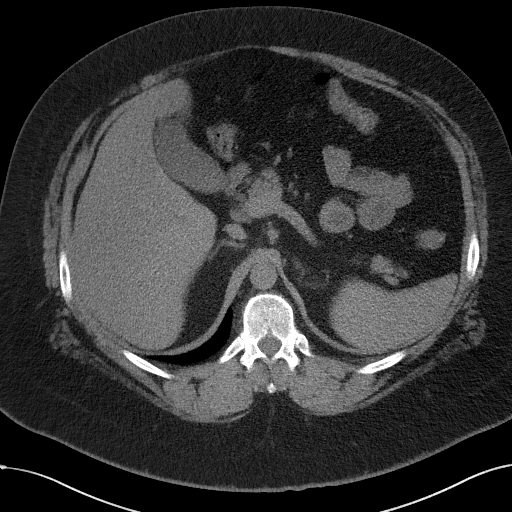
[im 81/104  soft-tissue]
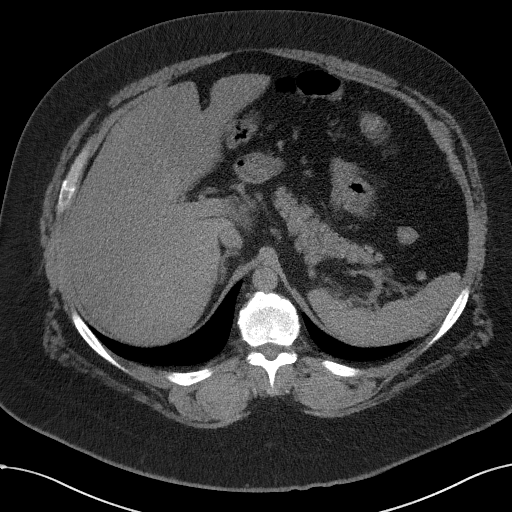
[im 90/104  soft-tissue]
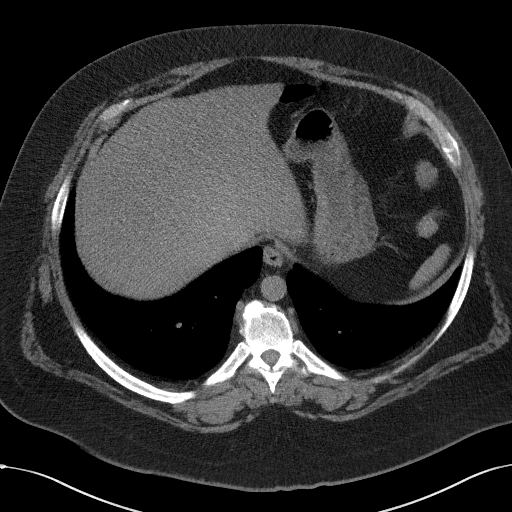
[im 99/104  soft-tissue]
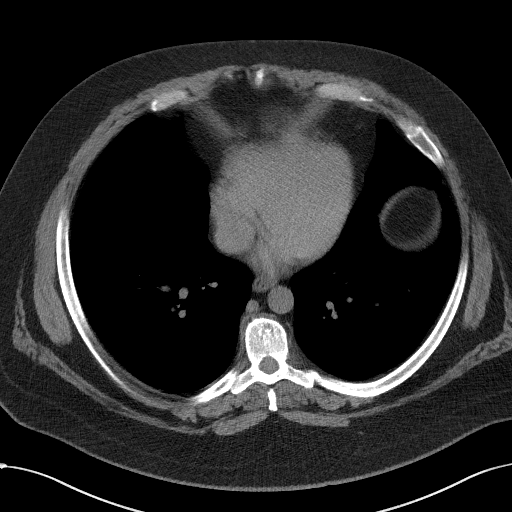

[Series 5: coronal · coronal · 0.91mm/px · 3 of 183 slices shown]
[im 61/183  soft-tissue]
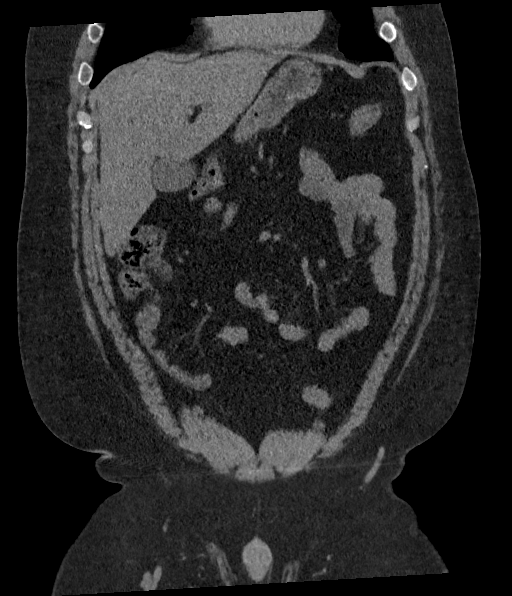
[im 81/183  soft-tissue]
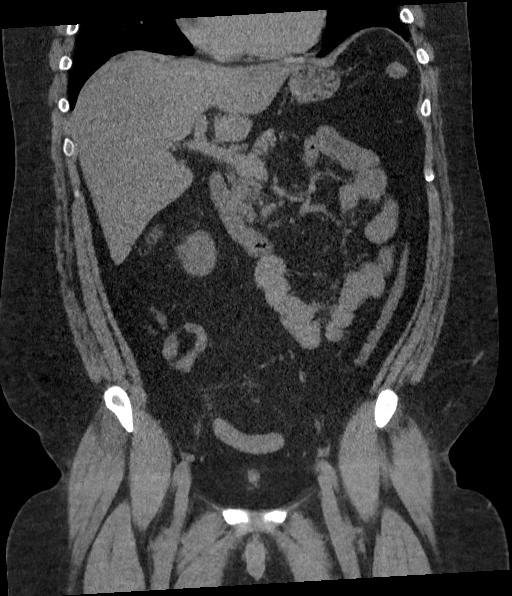
[im 102/183  soft-tissue]
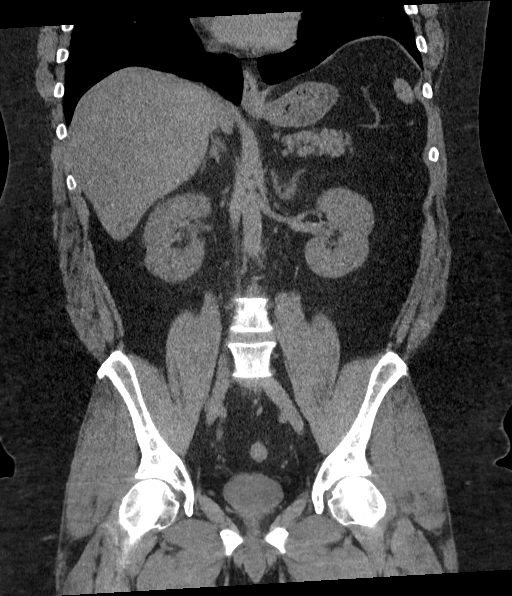

[16 of 46 positions shown; findings below may reference images not displayed]

FINDINGS: Lower chest: No acute abnormality.

Hepatobiliary: No focal liver abnormality is seen. No gallstones,
gallbladder wall thickening, or biliary dilatation.

Pancreas: Unremarkable. No pancreatic ductal dilatation or
surrounding inflammatory changes.

Spleen: Normal in size without focal abnormality.

Adrenals/Urinary Tract: Normal adrenal glands. Mild right-sided
hydronephrosis. The right ureter is also mildly dilated. There is a
2 mm obstructing stone at the right ureterovesicular junction. There
may be an additional punctate stone in the right lower pole
collecting system. The left kidney is normal in appearance.

Stomach/Bowel: No evidence of obstruction or focal bowel wall
thickening. Normal appendix in the right lower quadrant. The
terminal ileum is unremarkable.

Vascular/Lymphatic: Limited evaluation in the absence of intravenous
contrast. No suspicious lymphadenopathy. No evidence of aneurysm or
significant atherosclerotic plaque.

Reproductive: Prostate is unremarkable.

Other: No abdominal wall hernia or abnormality. No abdominopelvic
ascites.

Musculoskeletal: No acute fracture or aggressive appearing lytic or
blastic osseous lesion.
IMPRESSION: At least partially obstructing 2 mm stone at the right UVJ resulting
in mild hydroureteronephrosis and perinephric edema.

## 2021-08-01 ENCOUNTER — Other Ambulatory Visit: Payer: Self-pay | Admitting: Family Medicine

## 2021-08-02 ENCOUNTER — Encounter: Payer: Self-pay | Admitting: Nurse Practitioner

## 2021-08-02 ENCOUNTER — Ambulatory Visit (INDEPENDENT_AMBULATORY_CARE_PROVIDER_SITE_OTHER): Payer: BC Managed Care – PPO | Admitting: Nurse Practitioner

## 2021-08-02 ENCOUNTER — Telehealth: Payer: BC Managed Care – PPO | Admitting: Family Medicine

## 2021-08-02 ENCOUNTER — Other Ambulatory Visit: Payer: Self-pay

## 2021-08-02 VITALS — BP 112/74 | HR 96 | Temp 98.5°F

## 2021-08-02 DIAGNOSIS — J029 Acute pharyngitis, unspecified: Secondary | ICD-10-CM | POA: Insufficient documentation

## 2021-08-02 MED ORDER — LIDOCAINE VISCOUS HCL 2 % MT SOLN: 15.0000 mL | OROMUCOSAL | 0 refills | Status: DC | PRN

## 2021-08-02 MED ORDER — AMOXICILLIN-POT CLAVULANATE 875-125 MG PO TABS: 1.0000 | ORAL_TABLET | Freq: Two times a day (BID) | ORAL | 0 refills | Status: AC

## 2021-08-02 MED ORDER — PREDNISONE 20 MG PO TABS: 40.0000 mg | ORAL_TABLET | Freq: Every day | ORAL | 0 refills | Status: AC

## 2021-08-02 NOTE — Assessment & Plan Note (Signed)
Acute and ongoing for 4 days.  Strep test in office negative, but high suspicion for strep based on exam.  Flu negative.  Covid testing sent.  At this time will send in Augmentin BID x 7 days, Prednisone 40 MG daily x 5 days, and viscous lidocaine for treatment.  Recommend rest and hydration at home.  May continue Tylenol as needed.  Return to office if any worsening or ongoing symptoms.

## 2021-08-02 NOTE — Telephone Encounter (Signed)
Patient will need an office visit for further refills. Courtesy refill. Requested Prescriptions  Pending Prescriptions Disp Refills  . escitalopram (LEXAPRO) 10 MG tablet [Pharmacy Med Name: ESCITALOPRAM 10 MG TABLET] 30 tablet 0    Sig: TAKE 1 TABLET BY MOUTH EVERY DAY     Psychiatry:  Antidepressants - SSRI Failed - 08/01/2021  1:24 AM      Failed - Valid encounter within last 6 months    Recent Outpatient Visits          6 months ago Morbid obesity (HCC)   [facility] [place], Megan P, DO   8 months ago Routine general medical examination at a health care facility   [facility], Megan P, DO   10 months ago COVID-19   [facility], [place], DO   1 year ago Situational anxiety   [facility] [place], [place], DO   1 year ago Situational anxiety   [facility] [place], [place], DO

## 2021-08-02 NOTE — Patient Instructions (Signed)
Sore Throat When you have a sore throat, your throat may feel: Tender. Burning. Irritated. Scratchy. Painful when you swallow. Painful when you talk. Many things can cause a sore throat, such as: An infection. Allergies. Dry air. Smoke or pollution. Radiation treatment for cancer. Gastroesophageal reflux disease (GERD). A tumor. A sore throat can be the first sign of another sickness. It can happen with other problems, like: Coughing. Sneezing. Fever. Swelling of the glands in the neck. Most sore throats go away without treatment. Follow these instructions at home:   Medicines Take over-the-counter and prescription medicines only as told by your doctor. Children often get sore throats. Do not give your child aspirin. Use throat sprays to soothe your throat as told by your health care provider. Managing pain To help with pain: Sip warm liquids, such as broth, herbal tea, or warm water. Eat or drink cold or frozen liquids, such as frozen ice pops. Rinse your mouth (gargle) with a salt water mixture 3-4 times a day or as needed. To make salt water, dissolve -1 tsp (3-6 g) of salt in 1 cup (237 mL) of warm water. Do not swallow this mixture. Suck on hard candy or throat lozenges. Put a cool-mist humidifier in your bedroom at night. Sit in the bathroom with the door closed for 5-10 minutes while you run hot water in the shower. General instructions Do not smoke or use any products that contain nicotine or tobacco. If you need help quitting, ask your doctor. Get plenty of rest. Drink enough fluid to keep your pee (urine) pale yellow. Wash your hands often for at least 20 seconds with soap and water. If soap and water are not available, use hand sanitizer. Contact a doctor if: You have a fever for more than 2-3 days. You keep having symptoms for more than 2-3 days. Your throat does not get better in 7 days. You have a fever and your symptoms suddenly get worse. Your child  who is 3 months to 3 years old has a temperature of 102.2F (39C) or higher. Get help right away if: You have trouble breathing. You cannot swallow fluids, soft foods, or your spit. You have swelling in your throat or neck that gets worse. You feel like you may vomit (nauseous) and this feeling lasts a long time. You cannot stop vomiting. These symptoms may be an emergency. Get help right away. Call your local emergency services (911 in the [place] Do not wait to see if the symptoms will go away. Do not drive yourself to the hospital. Summary A sore throat is a painful, burning, irritated, or scratchy throat. Many things can cause a sore throat. Take over-the-counter medicines only as told by your doctor. Get plenty of rest. Drink enough fluid to keep your pee (urine) pale yellow. Contact a doctor if your symptoms get worse or your sore throat does not get better within 7 days. This information is not intended to replace advice given to you by your health care provider. Make sure you discuss any questions you have with your health care provider. Document Revised: 11/16/2020 Document Reviewed: 11/16/2020 Elsevier Patient Education  2022 Elsevier Inc.  

## 2021-08-02 NOTE — Progress Notes (Signed)
BP 112/74   Pulse 96   Temp 98.5 F (36.9 C) (Oral)   SpO2 94%    Subjective:    Patient ID: Edward Price, male    DOB: 1986-04-27, 35 y.o.   MRN: [id number]  HPI: Edward Price is a 35 y.o. male  Chief Complaint  Patient presents with   Sore Throat    Patient is here for sore throat. Patient states when he woke up Monday morning his vulva was swollen and on top of his tongue. Patient states since then his ears are starting bother him. Patient states he is having pressure and aches in his ears. Patient states he has tried OTC Ibuprofen. Patient denies having any fevers.     UPPER RESPIRATORY TRACT INFECTION Presents for sore throat and swelling + sore ears.  Started on Monday with sore throat.  Does not recall history of strep throat.  Does not recall tonsils getting removed.  No Covid vaccinations or flu vaccines. Fever: no Cough:  a little Shortness of breath: no Wheezing: no Chest pain: none Chest tightness: no Chest congestion: no Nasal congestion: no Runny nose: no Post nasal drip: no Sneezing: no Sore throat: yes Swollen glands: no Sinus pressure: yes Headache: yes Face pain: no Toothache: yes Ear pain: yes bilateral Ear pressure: yes bilateral Eyes red/itching:no Eye drainage/crusting: no  Vomiting: no Rash: no Fatigue: yes Sick contacts: no Strep contacts: no  Context: fluctuating Recurrent sinusitis: no Relief with OTC cold/cough medications: yes  Treatments attempted:  Ibuprofen     Relevant past medical, surgical, family and social history reviewed and updated as indicated. Interim medical history since our last visit reviewed. Allergies and medications reviewed and updated.  Review of Systems  Constitutional:  Positive for fatigue. Negative for activity change, appetite change, diaphoresis and fever.  HENT:  Positive for ear pain, sinus pressure, sinus pain, sore throat and trouble swallowing. Negative for congestion, ear discharge, postnasal  drip, rhinorrhea and voice change.   Respiratory:  Positive for cough. Negative for chest tightness, shortness of breath and wheezing.   Cardiovascular:  Negative for chest pain, palpitations and leg swelling.  Gastrointestinal: Negative.   Neurological:  Positive for headaches. Negative for dizziness, syncope, weakness, light-headedness and numbness.  Psychiatric/Behavioral: Negative.     Per HPI unless specifically indicated above     Objective:    BP 112/74   Pulse 96   Temp 98.5 F (36.9 C) (Oral)   SpO2 94%   Wt Readings from Last 3 Encounters:  01/06/21 (!) 331 lb 9.6 oz (150.4 kg)  11/25/20 (!) 339 lb (153.8 kg)  11/01/20 (!) 340 lb (154.2 kg)    Physical Exam Vitals and nursing note reviewed.  Constitutional:      General: He is awake. He is not in acute distress.    Appearance: He is well-developed and well-groomed. He is obese. He is not ill-appearing or toxic-appearing.  HENT:     Head: Normocephalic and atraumatic.     Right Ear: Hearing, ear canal and external ear normal. No drainage. A middle ear effusion is present. Tympanic membrane is injected. Tympanic membrane is not perforated.     Left Ear: Hearing, ear canal and external ear normal. No drainage. A middle ear effusion is present. Tympanic membrane is injected. Tympanic membrane is not perforated.     Ears:     Comments: Bony landmarks able to be viewed.    Nose: Nose normal.     Mouth/Throat:  Mouth: Mucous membranes are moist.     Pharynx: Uvula midline. Pharyngeal swelling, oropharyngeal exudate, posterior oropharyngeal erythema and uvula swelling present.     Tonsils: Tonsillar exudate present. 2+ on the right. 2+ on the left.  Eyes:     General: Lids are normal.        Right eye: No discharge.        Left eye: No discharge.     Conjunctiva/sclera: Conjunctivae normal.     Pupils: Pupils are equal, round, and reactive to light.  Neck:     Thyroid: No thyromegaly.     Vascular: No carotid bruit  or JVD.     Trachea: Trachea normal.  Cardiovascular:     Rate and Rhythm: Normal rate and regular rhythm.     Heart sounds: Normal heart sounds, S1 normal and S2 normal. No murmur heard.   No gallop.  Pulmonary:     Effort: Pulmonary effort is normal.     Breath sounds: Normal breath sounds.  Abdominal:     General: Bowel sounds are normal.     Palpations: Abdomen is soft.  Musculoskeletal:        General: Normal range of motion.     Cervical back: Normal range of motion and neck supple.     Right lower leg: No edema.     Left lower leg: No edema.  Lymphadenopathy:     Head:     Right side of head: No submental, submandibular, tonsillar, preauricular or posterior auricular adenopathy.     Left side of head: No submental, submandibular, tonsillar, preauricular or posterior auricular adenopathy.  Skin:    General: Skin is warm and dry.     Capillary Refill: Capillary refill takes less than 2 seconds.     Findings: No rash.  Neurological:     Mental Status: He is alert and oriented to person, place, and time.     Deep Tendon Reflexes: Reflexes are normal and symmetric.  Psychiatric:        Attention and Perception: Attention normal.        Mood and Affect: Mood normal.        Speech: Speech normal.        Behavior: Behavior normal. Behavior is cooperative.        Thought Content: Thought content normal.    Results for orders placed or performed in visit on 11/25/20  Comprehensive metabolic panel  Result Value Ref Range   Glucose 94 65 - 99 mg/dL   BUN 9 6 - 20 mg/dL   Creatinine, Ser 1.03 0.76 - 1.27 mg/dL   eGFR 97 >59 mL/min/1.73   BUN/Creatinine Ratio 9 9 - 20   Sodium 142 134 - 144 mmol/L   Potassium 4.3 3.5 - 5.2 mmol/L   Chloride 104 96 - 106 mmol/L   CO2 20 20 - 29 mmol/L   Calcium 9.5 8.7 - 10.2 mg/dL   Total Protein 7.2 6.0 - 8.5 g/dL   Albumin 4.3 4.0 - 5.0 g/dL   Globulin, Total 2.9 1.5 - 4.5 g/dL   Albumin/Globulin Ratio 1.5 1.2 - 2.2   Bilirubin Total  <0.2 0.0 - 1.2 mg/dL   Alkaline Phosphatase 114 44 - 121 IU/L   AST 22 0 - 40 IU/L   ALT 27 0 - 44 IU/L  CBC with Differential/Platelet  Result Value Ref Range   WBC 9.7 3.4 - 10.8 x10E3/uL   RBC 5.16 4.14 - 5.80 x10E6/uL   Hemoglobin 15.5 13.0 -  17.7 g/dL   Hematocrit 46.3 37.5 - 51.0 %   MCV 90 79 - 97 fL   MCH 30.0 26.6 - 33.0 pg   MCHC 33.5 31.5 - 35.7 g/dL   RDW 13.6 11.6 - 15.4 %   Platelets 297 150 - 450 x10E3/uL   Neutrophils 56 Not Estab. %   Lymphs 37 Not Estab. %   Monocytes 6 Not Estab. %   Eos 1 Not Estab. %   Basos 0 Not Estab. %   Neutrophils Absolute 5.3 1.4 - 7.0 x10E3/uL   Lymphocytes Absolute 3.6 (H) 0.7 - 3.1 x10E3/uL   Monocytes Absolute 0.6 0.1 - 0.9 x10E3/uL   EOS (ABSOLUTE) 0.1 0.0 - 0.4 x10E3/uL   Basophils Absolute 0.0 0.0 - 0.2 x10E3/uL   Immature Granulocytes 0 Not Estab. %   Immature Grans (Abs) 0.0 0.0 - 0.1 x10E3/uL  Lipid Panel w/o Chol/HDL Ratio  Result Value Ref Range   Cholesterol, Total 190 100 - 199 mg/dL   Triglycerides 168 (H) 0 - 149 mg/dL   HDL 43 >39 mg/dL   VLDL Cholesterol Cal 30 5 - 40 mg/dL   LDL Chol Calc (NIH) 117 (H) 0 - 99 mg/dL  TSH  Result Value Ref Range   TSH 1.450 0.450 - 4.500 uIU/mL  Urinalysis, Routine w reflex microscopic  Result Value Ref Range   Specific Gravity, UA 1.030 1.005 - 1.030   pH, UA 5.5 5.0 - 7.5   Color, UA Yellow Yellow   Appearance Ur Clear Clear   Leukocytes,UA Negative Negative   Protein,UA Negative Negative/Trace   Glucose, UA Negative Negative   Ketones, UA Negative Negative   RBC, UA Negative Negative   Bilirubin, UA Negative Negative   Urobilinogen, Ur 0.2 0.2 - 1.0 mg/dL   Nitrite, UA Negative Negative  Hepatitis C Antibody  Result Value Ref Range   Hep C Virus Ab <0.1 0.0 - 0.9 s/co ratio  HIV Antibody (routine testing w rflx)  Result Value Ref Range   HIV Screen 4th Generation wRfx Non Reactive Non Reactive      Assessment & Plan:   Problem List Items Addressed This Visit        Other   Sore throat - Primary    Acute and ongoing for 4 days.  Strep test in office negative, but high suspicion for strep based on exam.  Flu negative.  Covid testing sent.  At this time will send in Augmentin BID x 7 days, Prednisone 40 MG daily x 5 days, and viscous lidocaine for treatment.  Recommend rest and hydration at home.  May continue Tylenol as needed.  Return to office if any worsening or ongoing symptoms.      Relevant Orders   Veritor Flu A/B Waived   Rapid Strep Screen ([facility] ONLY)   Novel Coronavirus, NAA ([facility])     Follow up plan: Return if symptoms worsen or fail to improve.

## 2021-08-03 LAB — NOVEL CORONAVIRUS, NAA: SARS-CoV-2, NAA: NOT DETECTED

## 2021-08-03 NOTE — Progress Notes (Signed)
Contacted via MyChart   Covid testing is negative, good news!!  Continue current treatment regimen and if not improved after treatment then return to office:)

## 2021-08-04 LAB — RAPID STREP SCREEN (MED CTR MEBANE ONLY): Strep Gp A Ag, IA W/Reflex: NEGATIVE

## 2021-08-04 LAB — CULTURE, GROUP A STREP

## 2021-08-04 LAB — VERITOR FLU A/B WAIVED
Influenza A: NEGATIVE
Influenza B: NEGATIVE

## 2021-08-04 NOTE — Progress Notes (Signed)
Contacted via MyChart   Good evening Edward Price, your swab came back not positive for Strep A, but strep was noted on swab, so I am glad we did treat.  Hope you are feeling better.:)

## 2021-08-22 ENCOUNTER — Telehealth: Payer: Self-pay | Admitting: Family Medicine

## 2021-08-22 ENCOUNTER — Other Ambulatory Visit: Payer: Self-pay

## 2021-08-22 ENCOUNTER — Ambulatory Visit (INDEPENDENT_AMBULATORY_CARE_PROVIDER_SITE_OTHER): Payer: BC Managed Care – PPO | Admitting: Family Medicine

## 2021-08-22 ENCOUNTER — Encounter: Payer: Self-pay | Admitting: Family Medicine

## 2021-08-22 VITALS — BP 128/80 | HR 86 | Temp 98.8°F | Ht 67.99 in | Wt 351.6 lb

## 2021-08-22 DIAGNOSIS — R450 Nervousness: Secondary | ICD-10-CM

## 2021-08-22 DIAGNOSIS — R109 Unspecified abdominal pain: Secondary | ICD-10-CM

## 2021-08-22 DIAGNOSIS — R10A0 Flank pain, unspecified side: Secondary | ICD-10-CM

## 2021-08-22 DIAGNOSIS — R1011 Right upper quadrant pain: Secondary | ICD-10-CM

## 2021-08-22 DIAGNOSIS — Z13220 Encounter for screening for lipoid disorders: Secondary | ICD-10-CM

## 2021-08-22 LAB — URINALYSIS, ROUTINE W REFLEX MICROSCOPIC
Bilirubin, UA: NEGATIVE
Glucose, UA: NEGATIVE
Ketones, UA: NEGATIVE
Leukocytes,UA: NEGATIVE
Nitrite, UA: NEGATIVE
Protein,UA: NEGATIVE
RBC, UA: NEGATIVE
Specific Gravity, UA: >1.03 — ABNORMAL HIGH (ref 1.005–1.030)
Urobilinogen, Ur: 0.2 mg/dL (ref 0.2–1.0)
pH, UA: 5.5 (ref 5.0–7.5)

## 2021-08-22 LAB — BAYER DCA HB A1C WAIVED: HB A1C (BAYER DCA - WAIVED): 5.8 % — ABNORMAL HIGH (ref 4.8–5.6)

## 2021-08-22 MED ORDER — TRAMADOL HCL 50 MG PO TABS: 50.0000 mg | ORAL_TABLET | Freq: Three times a day (TID) | ORAL | 0 refills | Status: DC | PRN

## 2021-08-22 MED ORDER — TAMSULOSIN HCL 0.4 MG PO CAPS: 0.4000 mg | ORAL_CAPSULE | Freq: Every day | ORAL | 0 refills | Status: DC

## 2021-08-22 NOTE — Progress Notes (Signed)
BP 128/80    Pulse 86    Temp 98.8 F (37.1 C) (Oral)    Ht 5' 7.99" (1.727 m)    Wt (!) 351 lb 9.6 oz (159.5 kg)    SpO2 98%    BMI 53.47 kg/m    Subjective:    Patient ID: Edward Price, male    DOB: 06-05-1986, 35 y.o.   MRN: [id number]  HPI: Edward Price is a 35 y.o. male  Chief Complaint  Patient presents with   Flank Pain    Right side pain for past week   URINARY SYMPTOMS Duration: 2 weeks Dysuria: no Urinary frequency: yes Urgency: yes Small volume voids: no Symptom severity:  moderate Urinary incontinence: no Foul odor: no Hematuria: yes Abdominal pain: no Back pain: yes Suprapubic pain/pressure: no Flank pain: yes- R  Fever:  no Vomiting: no Relief with cranberry juice: no Relief with pyridium: no Status: worse Previous urinary tract infection: no Recurrent urinary tract infection: no Sexual activity: monogomous History of sexually transmitted disease: no Penile discharge: no Treatments attempted: increasing fluids  Has been feeling jittery when he gets home from work a little sweaty and nauseous. He eats and he feels better. Has not checked his sugars. Does have a family history of diabetes. No feeling thirsty all the time, but has been having frequency.   Relevant past medical, surgical, family and social history reviewed and updated as indicated. Interim medical history since our last visit reviewed. Allergies and medications reviewed and updated.  Review of Systems  Constitutional: Negative.   HENT: Negative.    Respiratory: Negative.    Cardiovascular: Negative.   Gastrointestinal: Negative.   Genitourinary:  Positive for dysuria and frequency. Negative for decreased urine volume, difficulty urinating, enuresis, flank pain, genital sores, hematuria, penile discharge, penile pain, penile swelling, scrotal swelling, testicular pain and urgency.  Musculoskeletal: Negative.   Neurological: Negative.        Shakiness and jitteriness when he gets  home before he eats  Psychiatric/Behavioral: Negative.     Per HPI unless specifically indicated above     Objective:    BP 128/80    Pulse 86    Temp 98.8 F (37.1 C) (Oral)    Ht 5' 7.99" (1.727 m)    Wt (!) 351 lb 9.6 oz (159.5 kg)    SpO2 98%    BMI 53.47 kg/m   Wt Readings from Last 3 Encounters:  08/22/21 (!) 351 lb 9.6 oz (159.5 kg)  01/06/21 (!) 331 lb 9.6 oz (150.4 kg)  11/25/20 (!) 339 lb (153.8 kg)    Physical Exam Vitals and nursing note reviewed.  Constitutional:      General: He is not in acute distress.    Appearance: Normal appearance. He is obese. He is not ill-appearing, toxic-appearing or diaphoretic.  HENT:     Head: Normocephalic and atraumatic.     Right Ear: External ear normal.     Left Ear: External ear normal.     Nose: Nose normal.     Mouth/Throat:     Mouth: Mucous membranes are moist.     Pharynx: Oropharynx is clear.  Eyes:     General: No scleral icterus.       Right eye: No discharge.        Left eye: No discharge.     Extraocular Movements: Extraocular movements intact.     Conjunctiva/sclera: Conjunctivae normal.     Pupils: Pupils are equal, round, and reactive  to light.  Cardiovascular:     Rate and Rhythm: Normal rate and regular rhythm.     Pulses: Normal pulses.     Heart sounds: Normal heart sounds. No murmur heard.   No friction rub. No gallop.  Pulmonary:     Effort: Pulmonary effort is normal. No respiratory distress.     Breath sounds: Normal breath sounds. No stridor. No wheezing, rhonchi or rales.  Chest:     Chest wall: No tenderness.  Abdominal:     General: Abdomen is flat. Bowel sounds are normal. There is no distension.     Palpations: Abdomen is soft. There is no mass.     Tenderness: There is no abdominal tenderness. There is right CVA tenderness. There is no left CVA tenderness, guarding or rebound.     Hernia: No hernia is present.  Musculoskeletal:        General: Normal range of motion.     Cervical back:  Normal range of motion and neck supple.  Skin:    General: Skin is warm and dry.     Capillary Refill: Capillary refill takes less than 2 seconds.     Coloration: Skin is not jaundiced or pale.     Findings: No bruising, erythema, lesion or rash.  Neurological:     General: No focal deficit present.     Mental Status: He is alert and oriented to person, place, and time. Mental status is at baseline.  Psychiatric:        Mood and Affect: Mood normal.        Behavior: Behavior normal.        Thought Content: Thought content normal.        Judgment: Judgment normal.    Results for orders placed or performed in visit on 08/02/21  Rapid Strep Screen ([facility] ONLY)   Specimen: Other   Other  Result Value Ref Range   Strep Gp A Ag, IA W/Reflex Negative Negative  Novel Coronavirus, NAA ([facility])   Specimen: Nasopharyngeal(NP) swabs in vial transport medium  Result Value Ref Range   SARS-CoV-2, NAA Not Detected Not Detected  Culture, Group A Strep   Other  Result Value Ref Range   Strep A Culture Comment (A)   Veritor Flu A/B Waived  Result Value Ref Range   Influenza A Negative Negative   Influenza B Negative Negative      Assessment & Plan:   Problem List Items Addressed This Visit   None Visit Diagnoses     Flank pain    -  Primary   History of stones. No blood today. +CVA tenderness- will check CT to r/o kidney stone. Await results. Treat as needed.    Relevant Orders   Urinalysis, Routine w reflex microscopic   CT RENAL STONE STUDY   Jittery       Of unclear etiology. Labs drawn today. Await results.    Relevant Orders   CBC with Differential/Platelet   Comprehensive metabolic panel   Bayer DCA Hb A1c Waived   TSH   Screening for cholesterol level       Labs drawn today. Await results. Treat as needed.    Relevant Orders   Lipid Panel w/o Chol/HDL Ratio   Right upper quadrant abdominal pain       Relevant Orders   CT RENAL STONE STUDY        Follow  up plan: Return Pending results.Marland Kitchen

## 2021-08-22 NOTE — Telephone Encounter (Signed)
Called to do a peer to peer on Edward Price's CT. They are requiring a renal US before he can have his CT as he is below 35 years old. Will order STAT US of his kidney and treat for stone right now. If he passes it overnight, will hold on CT. Tramadol and flomax sent to his pharmacy. Will order CT when [place] comes back as it is unlikely to be diagnostic

## 2021-08-23 ENCOUNTER — Ambulatory Visit
Admission: RE | Admit: 2021-08-23 | Discharge: 2021-08-23 | Disposition: A | Discharge status: Home / Self Care | Payer: BC Managed Care – PPO | Source: Ambulatory Visit | Attending: Family Medicine | Admitting: Family Medicine

## 2021-08-23 DIAGNOSIS — R109 Unspecified abdominal pain: Secondary | ICD-10-CM

## 2021-08-23 LAB — COMPREHENSIVE METABOLIC PANEL
ALT: 35 IU/L (ref 0–44)
AST: 19 IU/L (ref 0–40)
Albumin/Globulin Ratio: 1.8 (ref 1.2–2.2)
Albumin: 4.7 g/dL (ref 4.0–5.0)
Alkaline Phosphatase: 127 IU/L — ABNORMAL HIGH (ref 44–121)
BUN/Creatinine Ratio: 16 (ref 9–20)
BUN: 17 mg/dL (ref 6–20)
Bilirubin Total: <0.2 mg/dL (ref 0.0–1.2)
CO2: 24 mmol/L (ref 20–29)
Calcium: 9.5 mg/dL (ref 8.7–10.2)
Chloride: 102 mmol/L (ref 96–106)
Creatinine, Ser: 1.04 mg/dL (ref 0.76–1.27)
Globulin, Total: 2.6 g/dL (ref 1.5–4.5)
Glucose: 73 mg/dL (ref 70–99)
Potassium: 4.7 mmol/L (ref 3.5–5.2)
Sodium: 139 mmol/L (ref 134–144)
Total Protein: 7.3 g/dL (ref 6.0–8.5)
eGFR: 95 mL/min/{1.73_m2} (ref >59)

## 2021-08-23 LAB — CBC WITH DIFFERENTIAL/PLATELET
Basophils Absolute: 0 10*3/uL (ref 0.0–0.2)
Basos: 0 %
EOS (ABSOLUTE): 0.1 10*3/uL (ref 0.0–0.4)
Eos: 1 %
Hematocrit: 48 % (ref 37.5–51.0)
Hemoglobin: 16.3 g/dL (ref 13.0–17.7)
Immature Grans (Abs): 0 10*3/uL (ref 0.0–0.1)
Immature Granulocytes: 0 %
Lymphocytes Absolute: 4.1 10*3/uL — ABNORMAL HIGH (ref 0.7–3.1)
Lymphs: 39 %
MCH: 30.2 pg (ref 26.6–33.0)
MCHC: 34 g/dL (ref 31.5–35.7)
MCV: 89 fL (ref 79–97)
Monocytes Absolute: 0.7 10*3/uL (ref 0.1–0.9)
Monocytes: 7 %
Neutrophils Absolute: 5.6 10*3/uL (ref 1.4–7.0)
Neutrophils: 53 %
Platelets: 294 10*3/uL (ref 150–450)
RBC: 5.39 x10E6/uL (ref 4.14–5.80)
RDW: 12.9 % (ref 11.6–15.4)
WBC: 10.7 10*3/uL (ref 3.4–10.8)

## 2021-08-23 LAB — LIPID PANEL W/O CHOL/HDL RATIO
Cholesterol, Total: 217 mg/dL — ABNORMAL HIGH (ref 100–199)
HDL: 45 mg/dL (ref >39)
LDL Chol Calc (NIH): 132 mg/dL — ABNORMAL HIGH (ref 0–99)
Triglycerides: 222 mg/dL — ABNORMAL HIGH (ref 0–149)
VLDL Cholesterol Cal: 40 mg/dL (ref 5–40)

## 2021-08-23 LAB — TSH: TSH: 1.49 u[IU]/mL (ref 0.450–4.500)

## 2021-08-24 ENCOUNTER — Telehealth: Payer: Self-pay | Admitting: Family Medicine

## 2021-08-24 MED ORDER — TRAMADOL HCL 50 MG PO TABS: 100.0000 mg | ORAL_TABLET | Freq: Three times a day (TID) | ORAL | 0 refills | Status: AC | PRN

## 2021-08-24 NOTE — Telephone Encounter (Signed)
OK for him to take 2 of his tramadol as needed. If not better by Tuesday- will get CT. Thanks!

## 2021-08-24 NOTE — Telephone Encounter (Signed)
Patient notified and verbalized understanding. 

## 2021-08-28 ENCOUNTER — Other Ambulatory Visit: Payer: Self-pay | Admitting: Family Medicine

## 2021-08-29 ENCOUNTER — Telehealth: Payer: Self-pay | Admitting: Family Medicine

## 2021-08-29 ENCOUNTER — Encounter: Payer: Self-pay | Admitting: Family Medicine

## 2021-08-29 ENCOUNTER — Other Ambulatory Visit: Payer: Self-pay | Admitting: Family Medicine

## 2021-08-29 ENCOUNTER — Other Ambulatory Visit: Payer: Self-pay

## 2021-08-29 ENCOUNTER — Ambulatory Visit
Admission: RE | Admit: 2021-08-29 | Discharge: 2021-08-29 | Disposition: A | Discharge status: Home / Self Care | Payer: BC Managed Care – PPO | Source: Ambulatory Visit | Attending: Family Medicine | Admitting: Family Medicine

## 2021-08-29 DIAGNOSIS — M160 Bilateral primary osteoarthritis of hip: Secondary | ICD-10-CM | POA: Diagnosis not present

## 2021-08-29 DIAGNOSIS — R1011 Right upper quadrant pain: Secondary | ICD-10-CM

## 2021-08-29 DIAGNOSIS — R10A0 Flank pain, unspecified side: Secondary | ICD-10-CM

## 2021-08-29 DIAGNOSIS — R109 Unspecified abdominal pain: Secondary | ICD-10-CM

## 2021-08-29 DIAGNOSIS — N200 Calculus of kidney: Secondary | ICD-10-CM | POA: Diagnosis not present

## 2021-08-29 DIAGNOSIS — M47896 Other spondylosis, lumbar region: Secondary | ICD-10-CM

## 2021-08-29 DIAGNOSIS — K3189 Other diseases of stomach and duodenum: Secondary | ICD-10-CM | POA: Diagnosis not present

## 2021-08-29 DIAGNOSIS — I878 Other specified disorders of veins: Secondary | ICD-10-CM | POA: Diagnosis not present

## 2021-08-29 MED ORDER — ESCITALOPRAM OXALATE 10 MG PO TABS: 15.0000 mg | ORAL_TABLET | Freq: Every day | ORAL | 0 refills | Status: DC

## 2021-08-29 MED ORDER — CYCLOBENZAPRINE HCL 10 MG PO TABS: 10.0000 mg | ORAL_TABLET | Freq: Every day | ORAL | 0 refills | Status: AC

## 2021-08-29 NOTE — Telephone Encounter (Signed)
Reviewed CT with patient. No kidney stone, but end plate spurring. Stop flomax and tramadol and start flexeril and stretches. Recheck 2 weeks. If not improving will get him into PT.   Please schedule follow up in 2 weeks. Thanks!

## 2021-08-29 NOTE — Telephone Encounter (Signed)
Requested Prescriptions  Pending Prescriptions Disp Refills   escitalopram (LEXAPRO) 10 MG tablet [Pharmacy Med Name: ESCITALOPRAM 10 MG TABLET] 30 tablet 0    Sig: TAKE 1 TABLET BY MOUTH EVERY DAY     Psychiatry:  Antidepressants - SSRI Passed - 08/28/2021  1:43 AM      Passed - Valid encounter within last 6 months    Recent Outpatient Visits          1 week ago Flank pain   [facility] [place], Megan P, DO   3 weeks ago Sore throat   [facility] [place], Hedrick T, NP   7 months ago Morbid obesity (HCC)   [facility] [place], Megan P, DO   9 months ago Routine general medical examination at a health care facility   [facility], Megan P, DO   11 months ago COVID-19   [facility], [place], DO

## 2021-08-29 NOTE — Telephone Encounter (Signed)
Appt scheduled

## 2021-09-06 ENCOUNTER — Ambulatory Visit: Payer: BC Managed Care – PPO

## 2021-09-08 NOTE — Telephone Encounter (Signed)
He cancelled vis MyChart

## 2021-09-12 ENCOUNTER — Ambulatory Visit: Payer: BC Managed Care – PPO | Admitting: Family Medicine

## 2021-09-18 ENCOUNTER — Other Ambulatory Visit: Payer: Self-pay | Admitting: Family Medicine

## 2021-09-18 NOTE — Telephone Encounter (Signed)
No OV scheduled at this time. Approved per protocol.  Requested Prescriptions  Pending Prescriptions Disp Refills   tamsulosin (FLOMAX) 0.4 MG CAPS capsule [Pharmacy Med Name: TAMSULOSIN HCL 0.4 MG CAPSULE] 90 capsule 0    Sig: TAKE 1 CAPSULE BY MOUTH [place]     Urology: Alpha-Adrenergic Blocker Passed - 09/18/2021  1:27 AM      Passed - Last BP in normal range    BP Readings from Last 1 Encounters:  08/22/21 128/80         Passed - Valid encounter within last 12 months    Recent Outpatient Visits          3 weeks ago Flank pain   [place], [place], DO   1 month ago Sore throat   [place] [facility], Delmar T, NP   8 months ago Morbid obesity ([place])   [place], Megan P, DO   9 months ago Routine general medical examination at a health care facility   [facility], [place] P, DO   12 months ago [place], [place], DO

## 2021-09-22 ENCOUNTER — Other Ambulatory Visit: Payer: Self-pay | Admitting: Family Medicine

## 2021-09-22 NOTE — Telephone Encounter (Signed)
Requested medication (s) are due for refill today: yes, one week  Requested medication (s) are on the active medication list: yes  Last refill:  08/29/21  Future visit scheduled: no, canceled 09/12/21 after given curtesy refill on 08/29/21  Notes to clinic:  Has already had a curtesy refill and there is no upcoming appointment scheduled.   Requested Prescriptions  Pending Prescriptions Disp Refills   escitalopram (LEXAPRO) 10 MG tablet [Pharmacy Med Name: ESCITALOPRAM 10 MG TABLET] 30 tablet     Sig: TAKE 1 TABLET BY MOUTH EVERY DAY     Psychiatry:  Antidepressants - SSRI Passed - 09/22/2021  5:05 PM      Passed - Valid encounter within last 6 months    Recent Outpatient Visits           1 month ago Flank pain   [facility] [place], Megan P, DO   1 month ago Sore throat   [facility] [place], [place] T, NP   8 months ago Morbid obesity (HCC)   [facility] [place], Megan P, DO   10 months ago Routine general medical examination at a health care facility   [facility], Megan P, DO   12 months ago COVID-19   [facility], [place], DO

## 2021-10-22 ENCOUNTER — Other Ambulatory Visit: Payer: Self-pay | Admitting: Family Medicine

## 2021-10-23 NOTE — Telephone Encounter (Signed)
Requested medication (s) are due for refill today: yes  Requested medication (s) are on the active medication list: yes    Last refill: 09/25/21  #30  0 refills  Future visit scheduled no  Notes to clinic:Please review. Pharmacy requesting 1 -1/2 tabs daily  Requested Prescriptions  Pending Prescriptions Disp Refills   escitalopram (LEXAPRO) 10 MG tablet [Pharmacy Med Name: ESCITALOPRAM 10 MG TABLET] 45 tablet     Sig: TAKE 1 AND 1/2 TABLETS DAILY BY MOUTH     Psychiatry:  Antidepressants - SSRI Passed - 10/22/2021  9:07 AM      Passed - Valid encounter within last 6 months    Recent Outpatient Visits           2 months ago Flank pain   [facility] [place], Megan P, DO   2 months ago Sore throat   [facility] [place], Corrie Dandy T, NP   9 months ago Morbid obesity (HCC)   [facility] [place], Megan P, DO   11 months ago Routine general medical examination at a health care facility   [facility] [place], [place], DO   1 year ago COVID-19   [facility], [place], DO

## 2021-10-23 NOTE — Telephone Encounter (Signed)
Patient is overdue for his routine follow up. Please call to schedule.

## 2021-10-30 ENCOUNTER — Ambulatory Visit (INDEPENDENT_AMBULATORY_CARE_PROVIDER_SITE_OTHER): Payer: BC Managed Care – PPO | Admitting: Family Medicine

## 2021-10-30 ENCOUNTER — Other Ambulatory Visit: Payer: Self-pay

## 2021-10-30 ENCOUNTER — Encounter: Payer: Self-pay | Admitting: Family Medicine

## 2021-10-30 VITALS — BP 117/80 | HR 90 | Temp 98.5°F | Ht 67.99 in | Wt 341.2 lb

## 2021-10-30 DIAGNOSIS — F418 Other specified anxiety disorders: Secondary | ICD-10-CM

## 2021-10-30 MED ORDER — ESCITALOPRAM OXALATE 10 MG PO TABS: ORAL_TABLET | ORAL | 1 refills | Status: DC

## 2021-10-30 NOTE — Assessment & Plan Note (Signed)
Will check on coverage for wegovy. If covered, will start. Would benefit from it.

## 2021-10-30 NOTE — Progress Notes (Signed)
BP 117/80    Pulse 90    Temp 98.5 F (36.9 C) (Oral)    Ht 5' 7.99" (1.727 m)    Wt (!) 341 lb 3.2 oz (154.8 kg)    SpO2 95%    BMI 51.89 kg/m    Subjective:    Patient ID: Edward Price, male    DOB: 12/26/1985, 36 y.o.   MRN: [id number]  HPI: Edward Price is a 36 y.o. male  Chief Complaint  Patient presents with   Obesity   situational anxiety   ANXIETY/STRESS Duration: chronnic Status:controlled Anxious mood: no  Excessive worrying: no Irritability: no  Sweating: no Nausea: no Palpitations:no Hyperventilation: no Panic attacks: no Agoraphobia: no  Obscessions/compulsions: no Depressed mood: no Depression screen [facility] 2/9 10/30/2021 11/25/2020 09/23/2020 05/30/2020 05/02/2020  Decreased Interest 0 0 0 0 0  Down, Depressed, Hopeless 0 0 0 0 0  PHQ - 2 Score 0 0 0 0 0  Altered sleeping 0 0 0 0 0  Tired, decreased energy 0 0 0 0 1  Change in appetite 0 0 0 0 3  Feeling bad or failure about yourself  0 0 0 0 0  Trouble concentrating 0 0 0 0 0  Moving slowly or fidgety/restless 0 0 0 0 0  Suicidal thoughts 0 0 0 0 0  PHQ-9 Score 0 0 0 0 4  Difficult doing work/chores Not difficult at all - Not difficult at all - -  Some recent data might be hidden   GAD 7 : Generalized Anxiety Score 10/30/2021 01/06/2021 05/30/2020 05/02/2020  Nervous, Anxious, on Edge 0 0 0 1  Control/stop worrying 0 0 0 3  Worry too much - different things 0 3 0 3  Trouble relaxing 0 0 0 0  Restless 0 0 0 0  Easily annoyed or irritable 0 3 0 1  Afraid - awful might happen 0 0 0 3  Total GAD 7 Score 0 6 0 11  Anxiety Difficulty Not difficult at all Not difficult at all - -   Anhedonia: no Weight changes: no Insomnia:  no  Hypersomnia: no Fatigue/loss of energy: no Feelings of worthlessness: no Feelings of guilt: no Impaired concentration/indecisiveness: no Suicidal ideations: no  Crying spells: no Recent Stressors/Life Changes: no   Relationship problems: no   Family stress: no     Financial  stress: no    Job stress: no    Recent death/loss: no  OBESITY Duration: chronic Previous attempts at weight loss: yes Complications of obesity: IFG Peak weight: 351lbs Weight loss goal: to be healthy Weight loss to date: 10 lbs Requesting obesity pharmacotherapy: yes Current weight loss supplements/medications: no Previous weight loss supplements/meds: yes   Relevant past medical, surgical, family and social history reviewed and updated as indicated. Interim medical history since our last visit reviewed. Allergies and medications reviewed and updated.  Review of Systems  Constitutional: Negative.   Respiratory: Negative.    Cardiovascular: Negative.   Musculoskeletal: Negative.   Neurological: Negative.   Psychiatric/Behavioral: Negative.     Per HPI unless specifically indicated above     Objective:    BP 117/80    Pulse 90    Temp 98.5 F (36.9 C) (Oral)    Ht 5' 7.99" (1.727 m)    Wt (!) 341 lb 3.2 oz (154.8 kg)    SpO2 95%    BMI 51.89 kg/m   Wt Readings from Last 3 Encounters:  10/30/21 (!) 341 lb 3.2  oz (154.8 kg)  08/22/21 (!) 351 lb 9.6 oz (159.5 kg)  01/06/21 (!) 331 lb 9.6 oz (150.4 kg)    Physical Exam Vitals and nursing note reviewed.  Constitutional:      General: He is not in acute distress.    Appearance: Normal appearance. He is obese. He is not ill-appearing, toxic-appearing or diaphoretic.  HENT:     Head: Normocephalic and atraumatic.     Right Ear: External ear normal.     Left Ear: External ear normal.     Nose: Nose normal.     Mouth/Throat:     Mouth: Mucous membranes are moist.     Pharynx: Oropharynx is clear.  Eyes:     General: No scleral icterus.       Right eye: No discharge.        Left eye: No discharge.     Extraocular Movements: Extraocular movements intact.     Conjunctiva/sclera: Conjunctivae normal.     Pupils: Pupils are equal, round, and reactive to light.  Cardiovascular:     Rate and Rhythm: Normal rate and regular  rhythm.     Pulses: Normal pulses.     Heart sounds: Normal heart sounds. No murmur heard.   No friction rub. No gallop.  Pulmonary:     Effort: Pulmonary effort is normal. No respiratory distress.     Breath sounds: Normal breath sounds. No stridor. No wheezing, rhonchi or rales.  Chest:     Chest wall: No tenderness.  Musculoskeletal:        General: Normal range of motion.     Cervical back: Normal range of motion and neck supple.  Skin:    General: Skin is warm and dry.     Capillary Refill: Capillary refill takes less than 2 seconds.     Coloration: Skin is not jaundiced or pale.     Findings: No bruising, erythema, lesion or rash.  Neurological:     General: No focal deficit present.     Mental Status: He is alert and oriented to person, place, and time. Mental status is at baseline.  Psychiatric:        Mood and Affect: Mood normal.        Behavior: Behavior normal.        Thought Content: Thought content normal.        Judgment: Judgment normal.    Results for orders placed or performed in visit on 08/22/21  Urinalysis, Routine w reflex microscopic  Result Value Ref Range   Specific Gravity, UA >1.030 (H) 1.005 - 1.030   pH, UA 5.5 5.0 - 7.5   Color, UA Yellow Yellow   Appearance Ur Clear Clear   Leukocytes,UA Negative Negative   Protein,UA Negative Negative/Trace   Glucose, UA Negative Negative   Ketones, UA Negative Negative   RBC, UA Negative Negative   Bilirubin, UA Negative Negative   Urobilinogen, Ur 0.2 0.2 - 1.0 mg/dL   Nitrite, UA Negative Negative  CBC with Differential/Platelet  Result Value Ref Range   WBC 10.7 3.4 - 10.8 x10E3/uL   RBC 5.39 4.14 - 5.80 x10E6/uL   Hemoglobin 16.3 13.0 - 17.7 g/dL   Hematocrit 48.0 37.5 - 51.0 %   MCV 89 79 - 97 fL   MCH 30.2 26.6 - 33.0 pg   MCHC 34.0 31.5 - 35.7 g/dL   RDW 12.9 11.6 - 15.4 %   Platelets 294 150 - 450 x10E3/uL   Neutrophils 53 Not Estab. %  Lymphs 39 Not Estab. %   Monocytes 7 Not Estab. %    Eos 1 Not Estab. %   Basos 0 Not Estab. %   Neutrophils Absolute 5.6 1.4 - 7.0 x10E3/uL   Lymphocytes Absolute 4.1 (H) 0.7 - 3.1 x10E3/uL   Monocytes Absolute 0.7 0.1 - 0.9 x10E3/uL   EOS (ABSOLUTE) 0.1 0.0 - 0.4 x10E3/uL   Basophils Absolute 0.0 0.0 - 0.2 x10E3/uL   Immature Granulocytes 0 Not Estab. %   Immature Grans (Abs) 0.0 0.0 - 0.1 x10E3/uL  Comprehensive metabolic panel  Result Value Ref Range   Glucose 73 70 - 99 mg/dL   BUN 17 6 - 20 mg/dL   Creatinine, Ser 1.04 0.76 - 1.27 mg/dL   eGFR 95 >59 mL/min/1.73   BUN/Creatinine Ratio 16 9 - 20   Sodium 139 134 - 144 mmol/L   Potassium 4.7 3.5 - 5.2 mmol/L   Chloride 102 96 - 106 mmol/L   CO2 24 20 - 29 mmol/L   Calcium 9.5 8.7 - 10.2 mg/dL   Total Protein 7.3 6.0 - 8.5 g/dL   Albumin 4.7 4.0 - 5.0 g/dL   Globulin, Total 2.6 1.5 - 4.5 g/dL   Albumin/Globulin Ratio 1.8 1.2 - 2.2   Bilirubin Total <0.2 0.0 - 1.2 mg/dL   Alkaline Phosphatase 127 (H) 44 - 121 IU/L   AST 19 0 - 40 IU/L   ALT 35 0 - 44 IU/L  Bayer DCA Hb A1c Waived  Result Value Ref Range   HB A1C (BAYER DCA - WAIVED) 5.8 (H) 4.8 - 5.6 %  TSH  Result Value Ref Range   TSH 1.490 0.450 - 4.500 uIU/mL  Lipid Panel w/o Chol/HDL Ratio  Result Value Ref Range   Cholesterol, Total 217 (H) 100 - 199 mg/dL   Triglycerides 222 (H) 0 - 149 mg/dL   HDL 45 >39 mg/dL   VLDL Cholesterol Cal 40 5 - 40 mg/dL   LDL Chol Calc (NIH) 132 (H) 0 - 99 mg/dL      Assessment & Plan:   Problem List Items Addressed This Visit       Other   Morbid obesity ([place])    Will check on coverage for wegovy. If covered, will start. Would benefit from it.       Situational anxiety - Primary    Under good control on current regimen. Continue current regimen. Continue to monitor. Call with any concerns. Refills given.       Relevant Medications   escitalopram (LEXAPRO) 10 MG tablet     Follow up plan: Return in about 4 weeks (around 11/27/2021) for physical.

## 2021-10-30 NOTE — Assessment & Plan Note (Signed)
Under good control on current regimen. Continue current regimen. Continue to monitor. Call with any concerns. Refills given.   

## 2021-11-17 ENCOUNTER — Other Ambulatory Visit: Payer: Self-pay | Admitting: Nurse Practitioner

## 2021-11-17 NOTE — Telephone Encounter (Signed)
Requested by interface surescripts. Too soon . Last refill 10/18/21 #135 1 refill. ?Requested Prescriptions  ?Refused Prescriptions Disp Refills  ?? escitalopram (LEXAPRO) 10 MG tablet [Pharmacy Med Name: ESCITALOPRAM 10 MG TABLET] 30 tablet   ?  Sig: TAKE 1 TABLET BY MOUTH EVERY DAY  ?  ? Psychiatry:  Antidepressants - SSRI Passed - 11/17/2021  2:35 PM  ?  ?  Passed - Valid encounter within last 6 months  ?  Recent Outpatient Visits   ?      ? 2 weeks ago Situational anxiety  ? [facility] [place], Megan P, DO  ? 2 months ago Flank pain  ? [facility] [place], Megan P, DO  ? 3 months ago Sore throat  ? [facility] [place], [place] T, NP  ? 10 months ago Morbid obesity (HCC)  ? [facility] [place], Megan P, DO  ? 11 months ago Routine general medical examination at a health care facility  ? [facility] [place], [place] P, DO  ?  ?  ?Future Appointments   ?        ? In 1 week Laural Benes, Oralia Rud, DO [facility]  ?  ? ?  ?  ?  ? ?

## 2021-11-28 ENCOUNTER — Other Ambulatory Visit: Payer: Self-pay

## 2021-11-28 ENCOUNTER — Ambulatory Visit (INDEPENDENT_AMBULATORY_CARE_PROVIDER_SITE_OTHER): Payer: BC Managed Care – PPO | Admitting: Family Medicine

## 2021-11-28 ENCOUNTER — Encounter: Payer: Self-pay | Admitting: Family Medicine

## 2021-11-28 VITALS — BP 121/82 | HR 90 | Temp 98.1°F | Wt 343.0 lb

## 2021-11-28 DIAGNOSIS — F418 Other specified anxiety disorders: Secondary | ICD-10-CM | POA: Diagnosis not present

## 2021-11-28 DIAGNOSIS — Z0000 Encounter for general adult medical examination without abnormal findings: Secondary | ICD-10-CM | POA: Diagnosis not present

## 2021-11-28 MED ORDER — VENLAFAXINE HCL ER 75 MG PO CP24: 75.0000 mg | ORAL_CAPSULE | Freq: Every day | ORAL | 3 refills | Status: DC

## 2021-11-28 NOTE — Progress Notes (Signed)
? ?BP 121/82   Pulse 90   Temp 98.1 ?F (36.7 ?C)   Wt (!) 343 lb (155.6 kg)   SpO2 96%   BMI 52.17 kg/m?   ? ?Subjective:  ? ? Patient ID: Edward Price, male    DOB: 13-Apr-1986, 36 y.o.   MRN: [id number] ? ?HPI: ?Edward Price is a 36 y.o. male presenting on 11/28/2021 for comprehensive medical examination. Current medical complaints include: ? ?DEPRESSION ?Mood status: controlled ?Satisfied with current treatment?: no ?Symptom severity: mild  ?Duration of current treatment : chronic ?Side effects: yes- ED ?Medication compliance: excellent compliance ?Psychotherapy/counseling: no  ?Previous psychiatric medications: lexapro, sertraline, wellbutrin ?Depressed mood: yes ?Anxious mood: no ?Anhedonia: no ?Significant weight loss or gain: no ?Insomnia: no  ?Fatigue: yes ?Feelings of worthlessness or guilt: no ?Impaired concentration/indecisiveness: no ?Suicidal ideations: no ?Hopelessness: no ?Crying spells: no ? ?  11/28/2021  ?  2:25 PM 10/30/2021  ?  4:10 PM 11/25/2020  ?  3:08 PM 09/23/2020  ?  2:24 PM 05/30/2020  ?  4:20 PM  ?Depression screen PHQ 2/9  ?Decreased Interest 0 0 0 0 0  ?Down, Depressed, Hopeless 0 0 0 0 0  ?PHQ - 2 Score 0 0 0 0 0  ?Altered sleeping 1 0 0 0 0  ?Tired, decreased energy 1 0 0 0 0  ?Change in appetite 1 0 0 0 0  ?Feeling bad or failure about yourself  0 0 0 0 0  ?Trouble concentrating 0 0 0 0 0  ?Moving slowly or fidgety/restless 0 0 0 0 0  ?Suicidal thoughts 0 0 0 0 0  ?PHQ-9 Score 3 0 0 0 0  ?Difficult doing work/chores  Not difficult at all  Not difficult at all   ? ? ?He currently lives with: wife and daughter ?Interim Problems from his last visit: no ? ?Depression Screen done today and results listed below:  ? ?  11/28/2021  ?  2:25 PM 10/30/2021  ?  4:10 PM 11/25/2020  ?  3:08 PM 09/23/2020  ?  2:24 PM 05/30/2020  ?  4:20 PM  ?Depression screen PHQ 2/9  ?Decreased Interest 0 0 0 0 0  ?Down, Depressed, Hopeless 0 0 0 0 0  ?PHQ - 2 Score 0 0 0 0 0  ?Altered sleeping 1 0 0 0 0  ?Tired,  decreased energy 1 0 0 0 0  ?Change in appetite 1 0 0 0 0  ?Feeling bad or failure about yourself  0 0 0 0 0  ?Trouble concentrating 0 0 0 0 0  ?Moving slowly or fidgety/restless 0 0 0 0 0  ?Suicidal thoughts 0 0 0 0 0  ?PHQ-9 Score 3 0 0 0 0  ?Difficult doing work/chores  Not difficult at all  Not difficult at all   ? ? ?Past Medical History:  ?Past Medical History:  ?Diagnosis Date  ? Morbid obesity (HCC)   ? Nephrolithiasis   ? ? ?Surgical History:  ?History reviewed. No pertinent surgical history. ? ?Medications:  ?Current Outpatient Medications on File Prior to Visit  ?Medication Sig  ? cyclobenzaprine (FLEXERIL) 10 MG tablet Take 1 tablet (10 mg total) by mouth at bedtime.  ? ondansetron (ZOFRAN ODT) 4 MG disintegrating tablet Take 1 tablet (4 mg total) by mouth every 8 (eight) hours as needed for nausea or vomiting.  ? ?No current facility-administered medications on file prior to visit.  ? ? ?Allergies:  ?Allergies  ?Allergen Reactions  ? Percocet [Oxycodone-Acetaminophen]  Nausea And Vomiting  ? ? ?Social History:  ?Social History  ? ?Socioeconomic History  ? Marital status: Married  ?  Spouse name: Not on file  ? Number of children: Not on file  ? Years of education: Not on file  ? Highest education level: Not on file  ?Occupational History  ? Not on file  ?Tobacco Use  ? Smoking status: Some Days  ?  Types: Cigarettes  ? Smokeless tobacco: Current  ?  Types: Chew  ?Vaping Use  ? Vaping Use: Never used  ?Substance and Sexual Activity  ? Alcohol use: No  ? Drug use: No  ? Sexual activity: Yes  ?Other Topics Concern  ? Not on file  ?Social History Narrative  ? Not on file  ? ?Social Determinants of Health  ? ?Financial Resource Strain: Not on file  ?Food Insecurity: Not on file  ?Transportation Needs: Not on file  ?Physical Activity: Not on file  ?Stress: Not on file  ?Social Connections: Not on file  ?Intimate Partner Violence: Not on file  ? ?Social History  ? ?Tobacco Use  ?Smoking Status Some Days  ?  Types: Cigarettes  ?Smokeless Tobacco Current  ? Types: Chew  ? ?Social History  ? ?Substance and Sexual Activity  ?Alcohol Use No  ? ? ?Family History:  ?Family History  ?Problem Relation Age of Onset  ? Diabetes Mother   ? Hypertension Mother   ? Diabetes Father   ? Hypertension Father   ? Heart disease Maternal Grandmother   ? Hypertension Maternal Grandmother   ? Cancer Maternal Grandfather   ?     liver  ? Heart disease Maternal Grandfather   ? Hypertension Maternal Grandfather   ? Hypertension Paternal Grandmother   ? Hypertension Paternal Grandfather   ? Stroke Neg Hx   ? COPD Neg Hx   ? ? ?Past medical history, surgical history, medications, allergies, family history and social history reviewed with patient today and changes made to appropriate areas of the chart.  ? ?Review of Systems  ?Constitutional: Negative.   ?HENT: Negative.    ?Eyes: Negative.   ?Respiratory: Negative.    ?Cardiovascular: Negative.   ?Gastrointestinal:  Positive for heartburn. Negative for abdominal pain, blood in stool, constipation, diarrhea, melena, nausea and vomiting.  ?Genitourinary: Negative.   ?Musculoskeletal: Negative.   ?     RLS  ?Skin: Negative.   ?Neurological: Negative.   ?Endo/Heme/Allergies:  Positive for environmental allergies. Negative for polydipsia. Does not bruise/bleed easily.  ?Psychiatric/Behavioral:  Positive for depression and memory loss. Negative for hallucinations, substance abuse and suicidal ideas. The patient is nervous/anxious. The patient does not have insomnia.   ?All other ROS negative except what is listed above and in the HPI.  ? ?   ?Objective:  ?  ?BP 121/82   Pulse 90   Temp 98.1 ?F (36.7 ?C)   Wt (!) 343 lb (155.6 kg)   SpO2 96%   BMI 52.17 kg/m?   ?Wt Readings from Last 3 Encounters:  ?11/28/21 (!) 343 lb (155.6 kg)  ?10/30/21 (!) 341 lb 3.2 oz (154.8 kg)  ?08/22/21 (!) 351 lb 9.6 oz (159.5 kg)  ?  ?Physical Exam ?Vitals and nursing note reviewed.  ?Constitutional:   ?   General: He  is not in acute distress. ?   Appearance: Normal appearance. He is obese. He is not ill-appearing, toxic-appearing or diaphoretic.  ?HENT:  ?   Head: Normocephalic and atraumatic.  ?   Right Ear:  Tympanic membrane, ear canal and external ear normal. There is no impacted cerumen.  ?   Left Ear: Tympanic membrane, ear canal and external ear normal. There is no impacted cerumen.  ?   Nose: Nose normal. No congestion or rhinorrhea.  ?   Mouth/Throat:  ?   Mouth: Mucous membranes are moist.  ?   Pharynx: Oropharynx is clear. No oropharyngeal exudate or posterior oropharyngeal erythema.  ?Eyes:  ?   General: No scleral icterus.    ?   Right eye: No discharge.     ?   Left eye: No discharge.  ?   Extraocular Movements: Extraocular movements intact.  ?   Conjunctiva/sclera: Conjunctivae normal.  ?   Pupils: Pupils are equal, round, and reactive to light.  ?Neck:  ?   Vascular: No carotid bruit.  ?Cardiovascular:  ?   Rate and Rhythm: Normal rate and regular rhythm.  ?   Pulses: Normal pulses.  ?   Heart sounds: No murmur heard. ?  No friction rub. No gallop.  ?Pulmonary:  ?   Effort: Pulmonary effort is normal. No respiratory distress.  ?   Breath sounds: Normal breath sounds. No stridor. No wheezing, rhonchi or rales.  ?Chest:  ?   Chest wall: No tenderness.  ?Abdominal:  ?   General: Abdomen is flat. Bowel sounds are normal. There is no distension.  ?   Palpations: Abdomen is soft. There is no mass.  ?   Tenderness: There is no abdominal tenderness. There is no right CVA tenderness, left CVA tenderness, guarding or rebound.  ?   Hernia: No hernia is present.  ?Genitourinary: ?   Comments: Genital exam deferred with shared decision making ?Musculoskeletal:     ?   General: No swelling, tenderness, deformity or signs of injury.  ?   Cervical back: Normal range of motion and neck supple. No rigidity or tenderness. No muscular tenderness.  ?   Right lower leg: No edema.  ?   Left lower leg: No edema.  ?Lymphadenopathy:  ?    Cervical: No cervical adenopathy.  ?Skin: ?   General: Skin is warm and dry.  ?   Capillary Refill: Capillary refill takes less than 2 seconds.  ?   Coloration: Skin is not jaundiced or pale.  ?   Findings: No bruising, er

## 2021-11-28 NOTE — Assessment & Plan Note (Signed)
ED on lexapro- will change to effexor and recheck 3 months. Call with any concerns of if not getting better.  ?

## 2022-01-11 ENCOUNTER — Ambulatory Visit (INDEPENDENT_AMBULATORY_CARE_PROVIDER_SITE_OTHER): Payer: BC Managed Care – PPO | Admitting: Family Medicine

## 2022-01-11 ENCOUNTER — Encounter: Payer: Self-pay | Admitting: Family Medicine

## 2022-01-11 VITALS — BP 130/86 | HR 93 | Temp 98.5°F | Wt 339.0 lb

## 2022-01-11 DIAGNOSIS — F418 Other specified anxiety disorders: Secondary | ICD-10-CM | POA: Diagnosis not present

## 2022-01-11 DIAGNOSIS — J029 Acute pharyngitis, unspecified: Secondary | ICD-10-CM | POA: Diagnosis not present

## 2022-01-11 MED ORDER — PREDNISONE 50 MG PO TABS: 50.0000 mg | ORAL_TABLET | Freq: Every day | ORAL | 0 refills | Status: AC

## 2022-01-11 MED ORDER — VENLAFAXINE HCL ER 75 MG PO CP24: 75.0000 mg | ORAL_CAPSULE | Freq: Every day | ORAL | 1 refills | Status: AC

## 2022-01-11 MED ORDER — TRIAMCINOLONE ACETONIDE 40 MG/ML IJ SUSP
40.0000 mg | Freq: Once | INTRAMUSCULAR | Status: AC
  Administered 2022-01-11: 40 mg via INTRAMUSCULAR

## 2022-01-11 NOTE — Progress Notes (Signed)
? ?BP 130/86   Pulse 93   Temp 98.5 ?F (36.9 ?C)   Wt (!) 339 lb (153.8 kg)   SpO2 97%   BMI 51.56 kg/m?   ? ?Subjective:  ? ? Patient ID: Edward Price, male    DOB: Nov 02, 1985, 36 y.o.   MRN: [id number] ? ?HPI: ?Edward Price is a 36 y.o. male ? ?Chief Complaint  ?Patient presents with  ? Situational anxiety  ? Ear Pain  ?  Patient states he is having right ear pain that goes down his throat. Patient states his right side of his throat is swollen and hurts to swallow solids. Patient states he had a fever last night but did not check the actual temperature.   ? ?UPPER RESPIRATORY TRACT INFECTION ?Duration: 1 day ?Worst symptom: sore throat and ear pain ?Fever: yes ?Cough: yes ?Shortness of breath: no ?Wheezing: no ?Chest pain: no ?Chest tightness: no ?Chest congestion: no ?Nasal congestion: no ?Runny nose: no ?Post nasal drip: no ?Sneezing: no ?Sore throat: yes ?Swollen glands: no ?Sinus pressure: no ?Headache: yes ?Face pain: no ?Toothache: no ?Ear pain: yes "right ?Ear pressure: no  ?Eyes red/itching:no ?Eye drainage/crusting: no  ?Vomiting: no ?Rash: no ?Fatigue: yes ?Sick contacts: unknown ?Strep contacts: no  ?Context: worse ?Recurrent sinusitis: no ?Relief with OTC cold/cough medications: no  ?Treatments attempted: none  ? ?ANXIETY/STRESS ?Duration: chronic ?Status:better ?Anxious mood: no  ?Excessive worrying: no ?Irritability: no  ?Sweating: no ?Nausea: no ?Palpitations:no ?Hyperventilation: no ?Panic attacks: no ?Agoraphobia: no  ?Obscessions/compulsions: no ?Depressed mood: no ? ?  11/28/2021  ?  2:25 PM 10/30/2021  ?  4:10 PM 11/25/2020  ?  3:08 PM 09/23/2020  ?  2:24 PM 05/30/2020  ?  4:20 PM  ?Depression screen PHQ 2/9  ?Decreased Interest 0 0 0 0 0  ?Down, Depressed, Hopeless 0 0 0 0 0  ?PHQ - 2 Score 0 0 0 0 0  ?Altered sleeping 1 0 0 0 0  ?Tired, decreased energy 1 0 0 0 0  ?Change in appetite 1 0 0 0 0  ?Feeling bad or failure about yourself  0 0 0 0 0  ?Trouble concentrating 0 0 0 0 0  ?Moving  slowly or fidgety/restless 0 0 0 0 0  ?Suicidal thoughts 0 0 0 0 0  ?PHQ-9 Score 3 0 0 0 0  ?Difficult doing work/chores  Not difficult at all  Not difficult at all   ? ? ?  11/28/2021  ?  2:25 PM 10/30/2021  ?  4:10 PM 01/06/2021  ?  3:56 PM 05/30/2020  ?  4:21 PM  ?GAD 7 : Generalized Anxiety Score  ?Nervous, Anxious, on Edge 0 0 0 0  ?Control/stop worrying 0 0 0 0  ?Worry too much - different things 0 0 3 0  ?Trouble relaxing 0 0 0 0  ?Restless 1 0 0 0  ?Easily annoyed or irritable 1 0 3 0  ?Afraid - awful might happen 0 0 0 0  ?Total GAD 7 Score 2 0 6 0  ?Anxiety Difficulty Not difficult at all Not difficult at all Not difficult at all   ? ?Anhedonia: no ?Weight changes: no ?Insomnia: no   ?Hypersomnia: no ?Fatigue/loss of energy: no ?Feelings of worthlessness: no ?Feelings of guilt: no ?Impaired concentration/indecisiveness: no ?Suicidal ideations: no  ?Crying spells: no ?Recent Stressors/Life Changes: no ?  Relationship problems: no ?  Family stress: no   ?  Financial stress: no  ?  Job  stress: no  ?  Recent death/loss: no ? ?Relevant past medical, surgical, family and social history reviewed and updated as indicated. Interim medical history since our last visit reviewed. ?Allergies and medications reviewed and updated. ? ?Review of Systems  ?Constitutional:  Positive for chills, diaphoresis and fatigue. Negative for activity change, appetite change, fever and unexpected weight change.  ?HENT:  Positive for congestion, ear pain and sore throat. Negative for dental problem, drooling, ear discharge, facial swelling, hearing loss, mouth sores, nosebleeds, postnasal drip, rhinorrhea, sinus pressure, sinus pain, sneezing, tinnitus, trouble swallowing and voice change.   ?Eyes: Negative.   ?Respiratory: Negative.    ?Cardiovascular: Negative.   ?Gastrointestinal: Negative.   ? ?Per HPI unless specifically indicated above ? ?   ?Objective:  ?  ?BP 130/86   Pulse 93   Temp 98.5 ?F (36.9 ?C)   Wt (!) 339 lb (153.8 kg)    SpO2 97%   BMI 51.56 kg/m?   ?Wt Readings from Last 3 Encounters:  ?01/11/22 (!) 339 lb (153.8 kg)  ?11/28/21 (!) 343 lb (155.6 kg)  ?10/30/21 (!) 341 lb 3.2 oz (154.8 kg)  ?  ?Physical Exam ?Vitals and nursing note reviewed.  ?Constitutional:   ?   General: He is not in acute distress. ?   Appearance: Normal appearance. He is obese. He is not ill-appearing, toxic-appearing or diaphoretic.  ?HENT:  ?   Head: Normocephalic and atraumatic.  ?   Right Ear: Tympanic membrane, ear canal and external ear normal.  ?   Left Ear: Tympanic membrane, ear canal and external ear normal.  ?   Nose: Rhinorrhea present.  ?   Mouth/Throat:  ?   Mouth: Mucous membranes are moist.  ?   Pharynx: Oropharynx is clear. Posterior oropharyngeal erythema present.  ?Eyes:  ?   General: No scleral icterus.    ?   Right eye: No discharge.     ?   Left eye: No discharge.  ?   Extraocular Movements: Extraocular movements intact.  ?   Conjunctiva/sclera: Conjunctivae normal.  ?   Pupils: Pupils are equal, round, and reactive to light.  ?Neck:  ?   Vascular: No carotid bruit.  ?Cardiovascular:  ?   Rate and Rhythm: Normal rate and regular rhythm.  ?   Pulses: Normal pulses.  ?   Heart sounds: Normal heart sounds. No murmur heard. ?  No friction rub. No gallop.  ?Pulmonary:  ?   Effort: Pulmonary effort is normal. No respiratory distress.  ?   Breath sounds: Normal breath sounds. No stridor. No wheezing, rhonchi or rales.  ?Chest:  ?   Chest wall: No tenderness.  ?Musculoskeletal:     ?   General: Normal range of motion.  ?   Cervical back: Normal range of motion and neck supple. No rigidity or tenderness.  ?Lymphadenopathy:  ?   Cervical: Cervical adenopathy present.  ?Skin: ?   General: Skin is warm and dry.  ?   Capillary Refill: Capillary refill takes less than 2 seconds.  ?   Coloration: Skin is not jaundiced or pale.  ?   Findings: No bruising, erythema, lesion or rash.  ?Neurological:  ?   General: No focal deficit present.  ?   Mental  Status: He is alert and oriented to person, place, and time. Mental status is at baseline.  ?Psychiatric:     ?   Mood and Affect: Mood normal.     ?   Behavior: Behavior normal.     ?  Thought Content: Thought content normal.     ?   Judgment: Judgment normal.  ? ? ?Results for orders placed or performed in visit on 01/11/22  ?Rapid Strep screen([facility])  ? Specimen: Other  ? Other  ?Result Value Ref Range  ? Strep Gp A Ag, IA W/Reflex Negative Negative  ?Culture, Group A Strep  ? Other  ?Result Value Ref Range  ? Strep A Culture WILL FOLLOW   ? ?   ?Assessment & Plan:  ? ?Problem List Items Addressed This Visit   ? ?  ? Other  ? Situational anxiety  ?  Under good control on current regimen. Continue current regimen. Continue to monitor. Call with any concerns. Refills given.  ? ? ?  ?  ? Relevant Medications  ? venlafaxine XR (EFFEXOR XR) 75 MG 24 hr capsule  ? ?Other Visit Diagnoses   ? ? Sore throat    -  Primary  ? Strep negative. Doing better with triamcinalone. Await throat culture and treat with prednisone. Call if not getting better or getting worse.   ? Relevant Medications  ? triamcinolone acetonide (KENALOG-40) injection 40 mg (Completed)  ? Other Relevant Orders  ? Rapid Strep screen([facility]) (Completed)  ? ?  ?  ? ?Follow up plan: ?Return in about 6 months (around 07/14/2022) for physical. ? ? ? ? ? ?

## 2022-01-11 NOTE — Assessment & Plan Note (Signed)
Under good control on current regimen. Continue current regimen. Continue to monitor. Call with any concerns. Refills given.   

## 2022-01-12 ENCOUNTER — Ambulatory Visit: Payer: Self-pay

## 2022-01-12 ENCOUNTER — Ambulatory Visit
Admission: EM | Admit: 2022-01-12 | Discharge: 2022-01-12 | Disposition: A | Discharge status: Home / Self Care | Payer: BC Managed Care – PPO | Attending: Emergency Medicine | Admitting: Emergency Medicine

## 2022-01-12 DIAGNOSIS — J029 Acute pharyngitis, unspecified: Secondary | ICD-10-CM

## 2022-01-12 MED ORDER — LIDOCAINE VISCOUS HCL 2 % MT SOLN: 15.0000 mL | OROMUCOSAL | 0 refills | Status: AC | PRN

## 2022-01-12 MED ORDER — AMOXICILLIN 500 MG PO CAPS: 500.0000 mg | ORAL_CAPSULE | Freq: Two times a day (BID) | ORAL | 0 refills | Status: AC

## 2022-01-12 NOTE — Discharge Instructions (Signed)
On exam today your tonsils are swollen and there are Maysie Parkhill patches in your throat is red and, your ears do not appear to be infected, based on your examination we will get an use of antibiotics ? ?Take amoxicillin twice daily for the next 10 days, ideally should start to see improvement after 48 hours use of medication and steady progression ? ?You may finish your steroid course to help minimize the size of your tonsils ? ?You may gargle and spit lidocaine solution every 4 hours as needed to give a temporary numbing effect to your throat ? ?Until you are able to fully eat again and increase your fluid intake to prevent dehydration, use water and electrolyte replacement substances ? ?You may use Tylenol or ibuprofen every 6 hours for comfort and to minimize fevers ? ?You may attempt salt water gargles, throat lozenges, warm liquids, teaspoons of honey for additional comfort ? ?You may follow-up with urgent care or your PCP for reevaluation as needed ?

## 2022-01-12 NOTE — Telephone Encounter (Signed)
?  Chief Complaint: right ear pain -severe ?Symptoms: headache and right ear pain ?Frequency: yesterday ?Pertinent Negatives: Patient denies cough productive with green and blood flecks, sore throat, fever, stiff neck, right ear discharge.  ?Disposition: [] ED /[] Urgent Care (no appt availability in office) / [] Appointment(In office/virtual)/ []  [facility]/ [] Home Care/ [] Refused Recommended Disposition /[] [facility]/ [x]  Follow-up with PCP ?Additional Notes: Called office and stated to send over for review ?Disposition advised UC or PCP review. He may need ED instead ? ? ? ? ?Reason for Disposition ? [1] SEVERE pain AND [2] not improved 2 hours after taking analgesic medication (e.g., ibuprofen or acetaminophen) ? ?Answer Assessment - Initial Assessment Questions ?1. LOCATION: "Which ear is involved?" ?    Right ear ?2. ONSET: "When did the ear start hurting"  ?    Since OV ?3. SEVERITY: "How bad is the pain?"  (Scale 1-10; mild, moderate or severe) ?  - MILD (1-3): doesn't interfere with normal activities  ?  - MODERATE (4-7): interferes with normal activities or awakens from sleep  ?  - SEVERE (8-10): excruciating pain, unable to do any normal activities  ?    Severe 8/10 ?4. URI SYMPTOMS: "Do you have a runny nose or cough?" ?    Cough, sore throat, cough green and red flecks ?5. FEVER: "Do you have a fever?" If Yes, ask: "What is your temperature, how was it measured, and when did it start?" ?    Yes 100.4 ?6. CAUSE: "Have you been swimming recently?", "How often do you use Q-TIPS?", "Have you had any recent air travel or scuba diving?" ?    N/a ?7. OTHER SYMPTOMS: "Do you have any other symptoms?" (e.g., headache, stiff neck, dizziness, vomiting, runny nose, decreased hearing) ?    Stiff neck, headache severe ?8. PREGNANCY: "Is there any chance you are pregnant?" "When was your last menstrual period?" ?    N/A ? ?Protocols used: Earache-A-AH ? ?

## 2022-01-12 NOTE — Telephone Encounter (Signed)
If he's feeling worse again with the steroid I would advise him to go to the ER  ?

## 2022-01-12 NOTE — ED Provider Notes (Signed)
?[facility] ? ? ? ?CSN: [id number] ?Arrival date & time: 01/12/22  1654 ? ? ?  ? ?History   ?Chief Complaint ?Chief Complaint  ?Patient presents with  ? Ear Drainage  ?  Sore throat and ear pain - Entered by patient  ? Sore Throat  ? ? ?HPI ?Edward Price is a 36 y.o. male.  ? ?Patient presents with fever, sore throat and bilateral ear pain worse on the right side for 1 day.  Was evaluated by his PCP 1 day ago, strep test negative, pending culture, was prescribed prednisone which she endorses is minimally helpful but pain is worsening.  Difficulty eating, difficulty tolerating fluids.  No known sick contacts.  Denies cough, shortness of breath, wheezing, nasal congestion, rhinorrhea, headaches. ? ?Past Medical History:  ?Diagnosis Date  ? Morbid obesity (HCC)   ? Nephrolithiasis   ? ? ?Patient Active Problem List  ? Diagnosis Date Noted  ? 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 Morbid obesity (HCC)   ? ? ?History reviewed. No pertinent surgical history. ? ? ? ? ?Home Medications   ? ?Prior to Admission medications   ?Medication Sig Start Date End Date Taking? Authorizing Provider  ?amoxicillin (AMOXIL) 500 MG capsule Take 1 capsule (500 mg total) by mouth 2 (two) times daily for 10 days. 01/12/22 01/22/22 Yes Javonne Dorko, Elita BooneAdrienne R, NP  ?lidocaine (XYLOCAINE) 2 % solution Use as directed 15 mLs in the mouth or throat every 4 (four) hours as needed for mouth pain. 01/12/22  Yes Tamarra Geiselman, Elita BooneAdrienne R, NP  ?cyclobenzaprine (FLEXERIL) 10 MG tablet Take 1 tablet (10 mg total) by mouth at bedtime. ?Patient not taking: Reported on 01/11/2022 08/29/21   Olevia PerchesJohnson, Megan P, DO  ?ondansetron (ZOFRAN ODT) 4 MG disintegrating tablet Take 1 tablet (4 mg total) by mouth every 8 (eight) hours as needed for nausea or vomiting. 11/01/20   Concha SeFunke, Mary E, MD  ?predniSONE (DELTASONE) 50 MG tablet Take 1 tablet (50 mg total) by mouth daily with breakfast. 01/11/22   Olevia PerchesJohnson, Megan P,  DO  ?venlafaxine XR (EFFEXOR XR) 75 MG 24 hr capsule Take 1 capsule (75 mg total) by mouth daily with breakfast. 01/11/22   Dorcas CarrowJohnson, Megan P, DO  ? ? ?Family History ?Family History  ?Problem Relation Age of Onset  ? Diabetes Mother   ? Hypertension Mother   ? Diabetes Father   ? Hypertension Father   ? Heart disease Maternal Grandmother   ? Hypertension Maternal Grandmother   ? Cancer Maternal Grandfather   ?     liver  ? Heart disease Maternal Grandfather   ? Hypertension Maternal Grandfather   ? Hypertension Paternal Grandmother   ? Hypertension Paternal Grandfather   ? Stroke Neg Hx   ? COPD Neg Hx   ? ? ?Social History ?Social History  ? ?Tobacco Use  ? Smoking status: Some Days  ?  Types: Cigarettes  ? Smokeless tobacco: Current  ?  Types: Chew  ?Vaping Use  ? Vaping Use: Never used  ?Substance Use Topics  ? Alcohol use: No  ? Drug use: No  ? ? ? ?Allergies   ?Percocet [oxycodone-acetaminophen] ? ? ?Review of Systems ?Review of Systems ?Defer to HPI ? ? ?Physical Exam ?Triage Vital Signs ?ED Triage Vitals  ?Enc Vitals Group  ?   BP 01/12/22 1733 (!) 147/86  ?   Pulse Rate 01/12/22 1733 (!) 103  ?   Resp 01/12/22  1733 18  ?   Temp 01/12/22 1733 98.3 ?F (36.8 ?C)  ?   Temp Source 01/12/22 1733 Oral  ?   SpO2 01/12/22 1733 95 %  ?   Weight --   ?   Height --   ?   Head Circumference --   ?   Peak Flow --   ?   Pain Score 01/12/22 1732 7  ?   Pain Loc --   ?   Pain Edu? --   ?   Excl. in GC? --   ? ?No data found. ? ?Updated Vital Signs ?BP (!) 147/86 (BP Location: Left Arm)   Pulse (!) 103   Temp 98.3 ?F (36.8 ?C) (Oral)   Resp 18   SpO2 95%  ? ?Visual Acuity ?Right Eye Distance:   ?Left Eye Distance:   ?Bilateral Distance:   ? ?Right Eye Near:   ?Left Eye Near:    ?Bilateral Near:    ? ?Physical Exam ?Constitutional:   ?   Appearance: He is well-developed.  ?HENT:  ?   Head: Normocephalic.  ?   Right Ear: Tympanic membrane and ear canal normal.  ?   Left Ear: Tympanic membrane and ear canal normal.  ?   Nose:  No congestion or rhinorrhea.  ?   Mouth/Throat:  ?   Pharynx: Posterior oropharyngeal erythema present.  ?   Tonsils: Tonsillar exudate present. 2+ on the right. 2+ on the left.  ?Cardiovascular:  ?   Rate and Rhythm: Normal rate and regular rhythm.  ?   Heart sounds: Normal heart sounds.  ?Pulmonary:  ?   Effort: Pulmonary effort is normal.  ?   Breath sounds: Normal breath sounds.  ?Musculoskeletal:  ?   Cervical back: Normal range of motion and neck supple.  ?Skin: ?   General: Skin is warm and dry.  ?Neurological:  ?   General: No focal deficit present.  ?   Mental Status: He is alert and oriented to person, place, and time.  ?Psychiatric:     ?   Mood and Affect: Mood normal.     ?   Behavior: Behavior normal.  ? ? ? ?UC Treatments / Results  ?Labs ?(all labs ordered are listed, but only abnormal results are displayed) ?Labs Reviewed - No data to display ? ?EKG ? ? ?Radiology ?No results found. ? ?Procedures ?Procedures (including critical care time) ? ?Medications Ordered in UC ?Medications - No data to display ? ?Initial Impression / Assessment and Plan / UC Course  ?I have reviewed the triage vital signs and the nursing notes. ? ?Pertinent labs & imaging results that were available during my care of the patient were reviewed by me and considered in my medical decision making (see chart for details). ? ?Sore throat ? ?Based on examination we will move forward with treatment with antibiotic course, amoxicillin 10-day treatment prescribed as well as viscous lidocaine for management of discomfort, may continue prednisone course to help minimize tonsillar adenopathy, recommended Tylenol and ibuprofen taken every 6 hours consistently to help reduce fevers and for additional support, may attempt salt water gargles, throat lozenges warm liquids teaspoons of honey and soft foods, may follow-up with urgent care or PCP for reevaluation as needed ?Final Clinical Impressions(s) / UC Diagnoses  ? ?Final diagnoses:  ?Sore  throat  ? ? ? ?Discharge Instructions   ? ?  ?On exam today your tonsils are swollen and there are Jaquavion Mccannon patches in your throat is  red and, your ears do not appear to be infected, based on your examination we will get an use of antibiotics ? ?Take amoxicillin twice daily for the next 10 days, ideally should start to see improvement after 48 hours use of medication and steady progression ? ?You may finish your steroid course to help minimize the size of your tonsils ? ?You may gargle and spit lidocaine solution every 4 hours as needed to give a temporary numbing effect to your throat ? ?Until you are able to fully eat again and increase your fluid intake to prevent dehydration, use water and electrolyte replacement substances ? ?You may use Tylenol or ibuprofen every 6 hours for comfort and to minimize fevers ? ?You may attempt salt water gargles, throat lozenges, warm liquids, teaspoons of honey for additional comfort ? ?You may follow-up with urgent care or your PCP for reevaluation as needed ? ? ?ED Prescriptions   ? ? Medication Sig Dispense Auth. Provider  ? amoxicillin (AMOXIL) 500 MG capsule Take 1 capsule (500 mg total) by mouth 2 (two) times daily for 10 days. 20 capsule Timika Muench R, NP  ? lidocaine (XYLOCAINE) 2 % solution Use as directed 15 mLs in the mouth or throat every 4 (four) hours as needed for mouth pain. 100 mL Valinda Hoar, NP  ? ?  ? ?PDMP not reviewed this encounter. ?  ?Valinda Hoar, NP ?01/12/22 1754 ? ?

## 2022-01-12 NOTE — ED Triage Notes (Signed)
Patient presents to Urgent Care with complaints of sore throat and ear pain since yesterday. He states he was seen by his PCP yesterday, had a negative strep test. Awaiting throat culture results at this time. He reports ear pain was believed to be associated with allergies. Pt reports he did receive a steroid injection and oral steroid prescription with no improvement. Treating pain with ibuprofen and tylenol. Last dose at 1400. ?

## 2022-01-12 NOTE — Telephone Encounter (Signed)
Patient states he would not be going to ED because he can't afford it. Patient states he will go to UC.  ?

## 2022-01-14 LAB — CULTURE, GROUP A STREP

## 2022-01-14 LAB — RAPID STREP SCREEN (MED CTR MEBANE ONLY): Strep Gp A Ag, IA W/Reflex: NEGATIVE

## 2022-01-20 ENCOUNTER — Other Ambulatory Visit: Payer: Self-pay | Admitting: Family Medicine

## 2022-01-22 NOTE — Telephone Encounter (Signed)
Requested medication (s) are due for refill today:   No  Requested medication (s) are on the active medication list:   No  Future visit scheduled:   No   Last ordered: 11/28/2021 discontinued   Returned because not on his med list.   Requested Prescriptions  Pending Prescriptions Disp Refills   tamsulosin (FLOMAX) 0.4 MG CAPS capsule [Pharmacy Med Name: TAMSULOSIN HCL 0.4 MG CAPSULE] 90 capsule 0    Sig: TAKE [place]     Urology: Alpha-Adrenergic Blocker Failed - 01/20/2022 10:20 AM      Failed - PSA in normal range and within 360 days    No results found for: LABPSA, PSA, PSA1, ULTRAPSA       Failed - Last BP in normal range    BP Readings from Last 1 Encounters:  01/12/22 (!) 147/86         Passed - Valid encounter within last 12 months    Recent Outpatient Visits           1 week ago Sore throat   [place], Megan P, DO   1 month ago Routine general medical examination at a health care facility   [facility], [place], DO   2 months ago Situational anxiety   [place], [place], DO   5 months ago Flank pain   [place], Megan P, DO   5 months ago Sore throat   [place], Barbaraann Faster, NP

## 2022-03-01 ENCOUNTER — Ambulatory Visit: Payer: BC Managed Care – PPO | Admitting: Family Medicine

## 2022-04-09 DIAGNOSIS — Z6841 Body Mass Index (BMI) 40.0 and over, adult: Secondary | ICD-10-CM | POA: Diagnosis not present

## 2022-04-09 DIAGNOSIS — Z79899 Other long term (current) drug therapy: Secondary | ICD-10-CM | POA: Diagnosis not present

## 2022-04-09 DIAGNOSIS — R531 Weakness: Secondary | ICD-10-CM | POA: Diagnosis not present

## 2022-04-09 DIAGNOSIS — R7309 Other abnormal glucose: Secondary | ICD-10-CM | POA: Diagnosis not present

## 2022-04-09 DIAGNOSIS — F418 Other specified anxiety disorders: Secondary | ICD-10-CM | POA: Diagnosis not present

## 2022-05-08 DIAGNOSIS — R7309 Other abnormal glucose: Secondary | ICD-10-CM | POA: Diagnosis not present

## 2022-05-08 DIAGNOSIS — R7989 Other specified abnormal findings of blood chemistry: Secondary | ICD-10-CM | POA: Diagnosis not present

## 2022-05-09 IMAGING — US US RENAL
1 series · 14 of 23 positions shown · non-contrast
Comparison: Abdominopelvic CT 10/31/2020.

CLINICAL DATA: Right flank pain for 2 weeks.

EXAM:
RENAL / URINARY TRACT ULTRASOUND COMPLETE

[Series 1: us renal · 0.26mm/px · 14 of 23 slices shown]
[im 1/23]
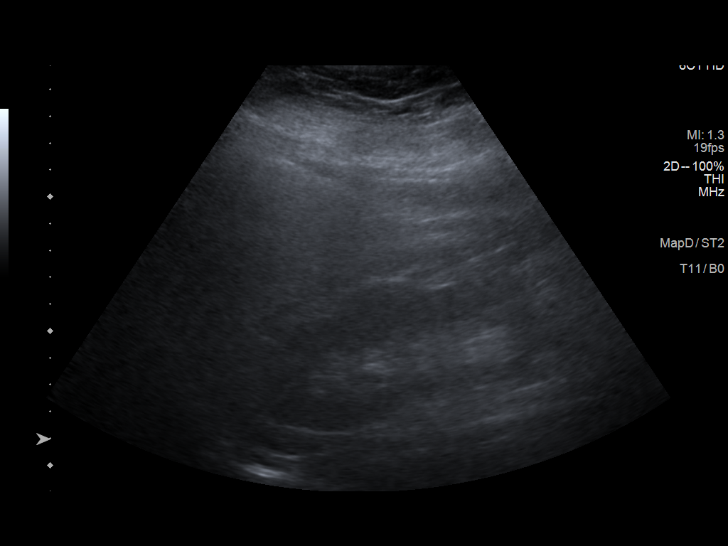
[im 3/23]
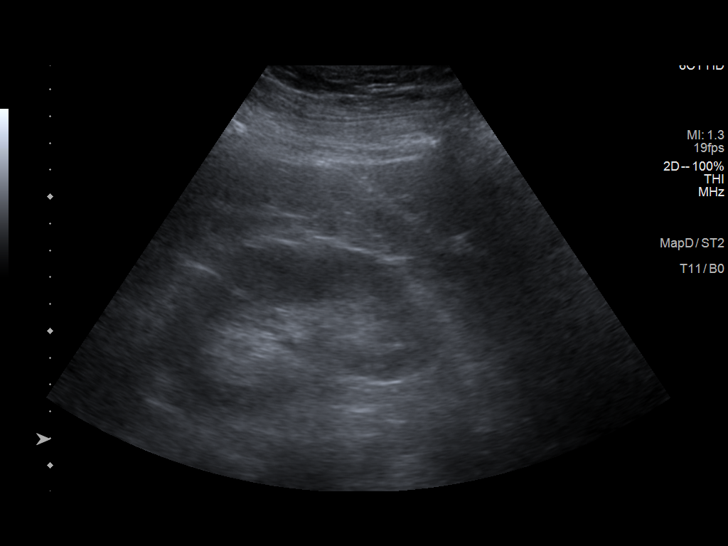
[im 5/23]
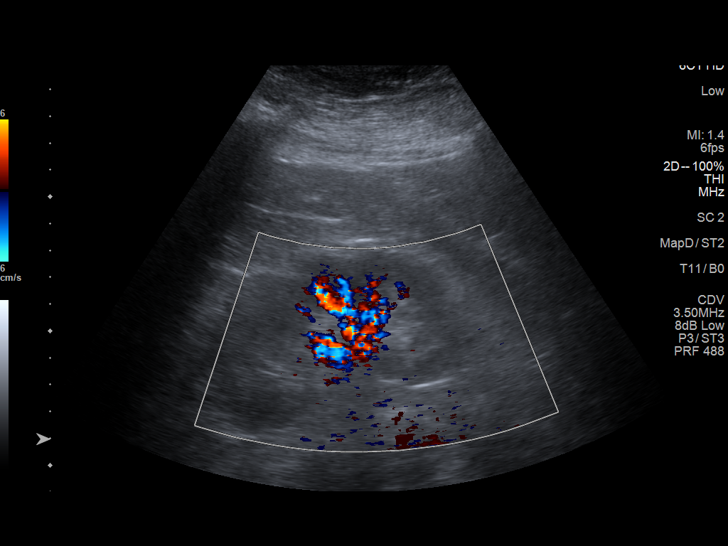
[im 6/23]
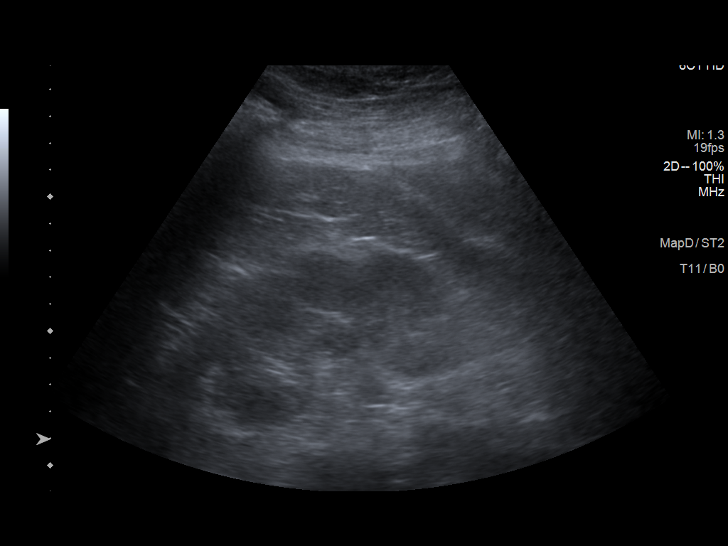
[im 8/23]
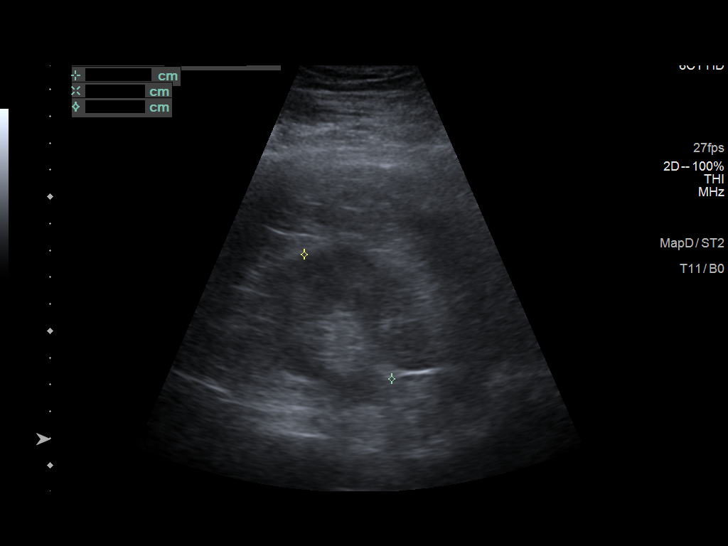
[im 10/23]
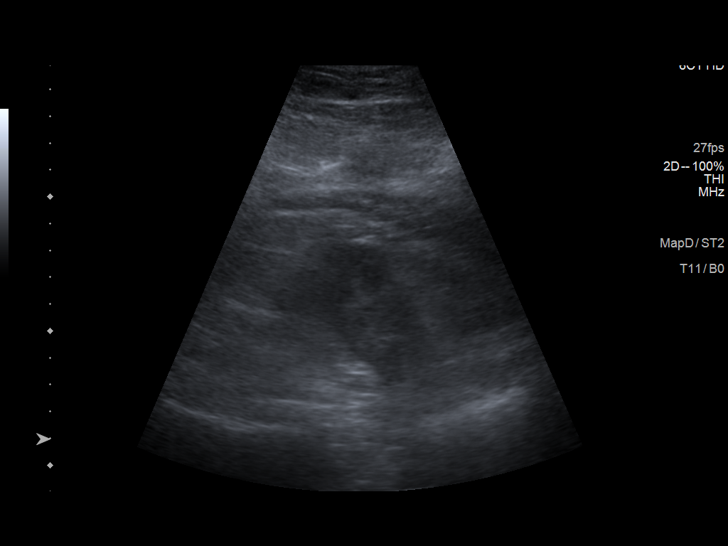
[im 11/23]
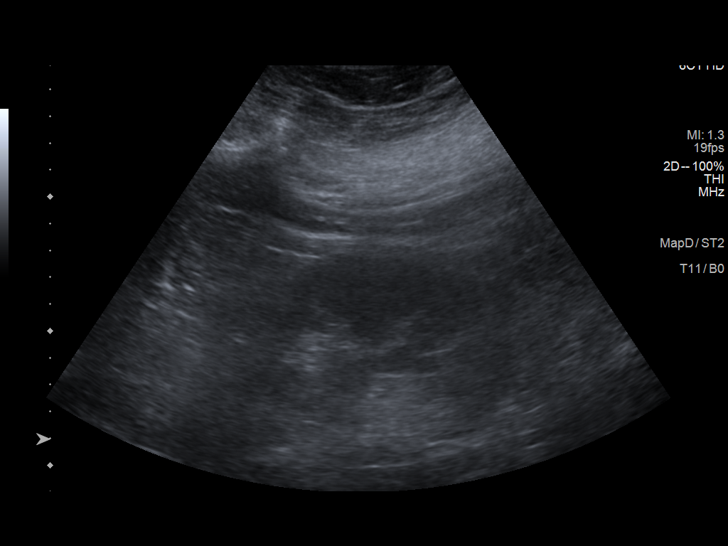
[im 13/23]
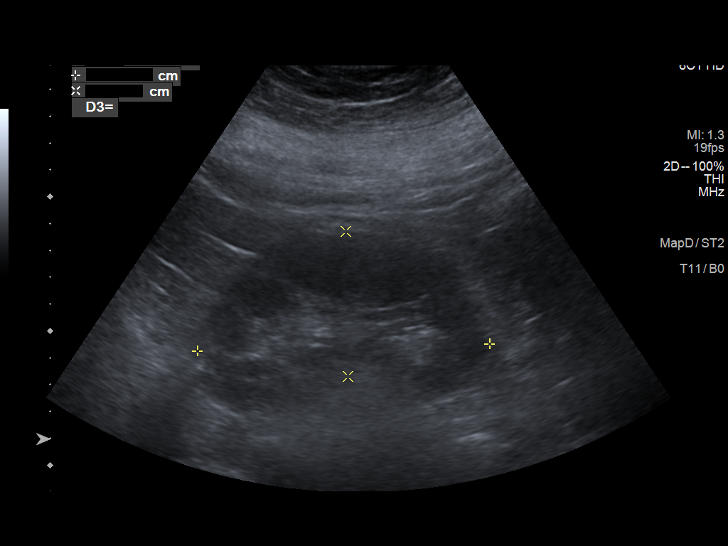
[im 14/23]
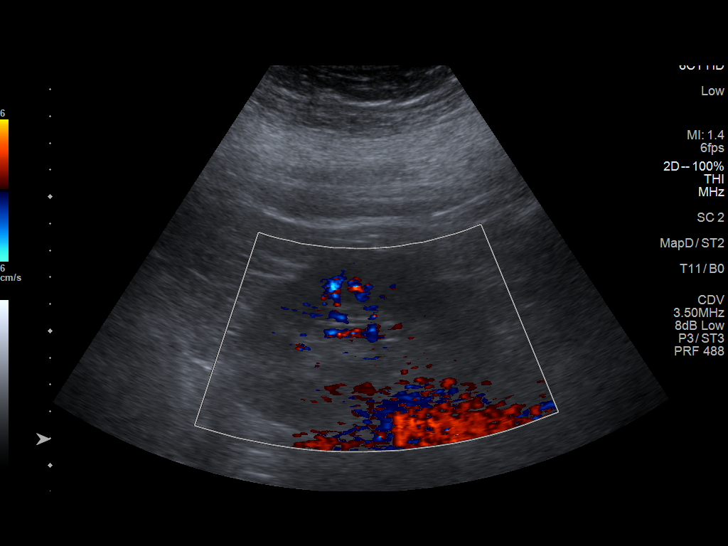
[im 16/23]
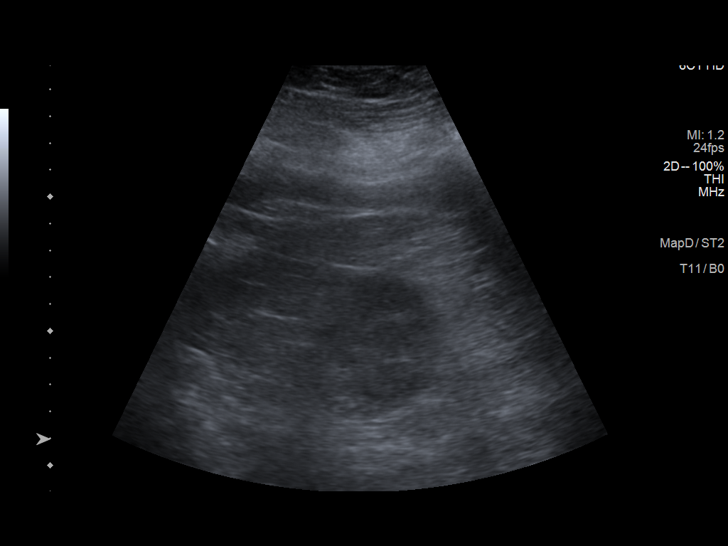
[im 18/23]
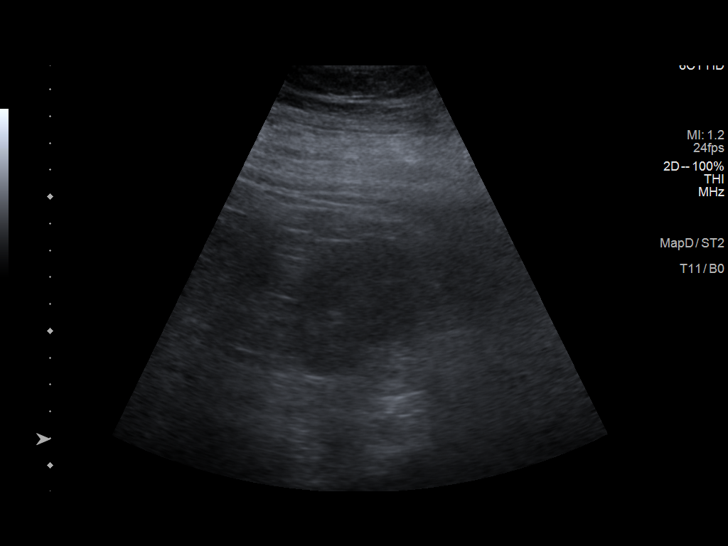
[im 19/23]
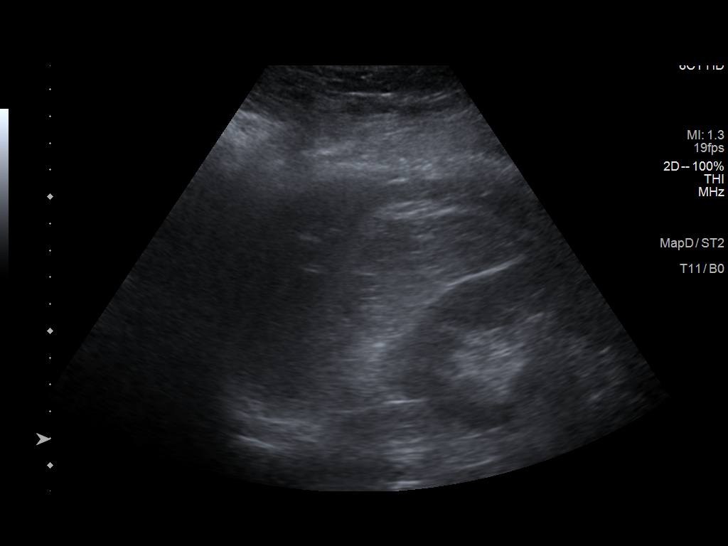
[im 21/23]
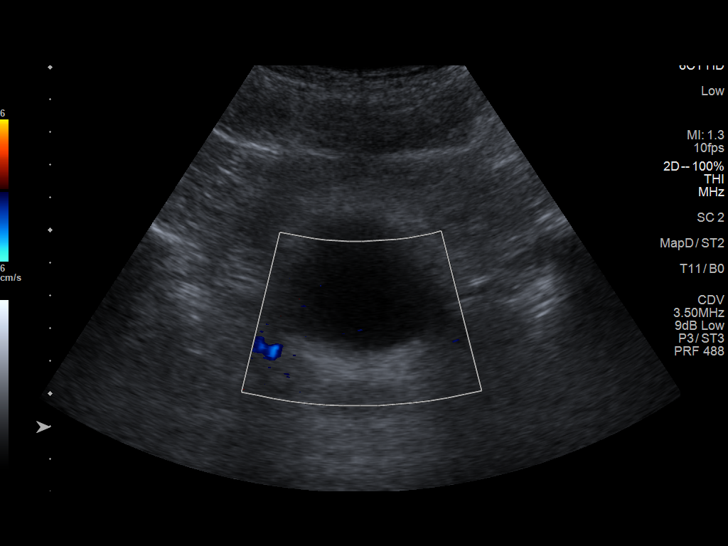
[im 23/23]
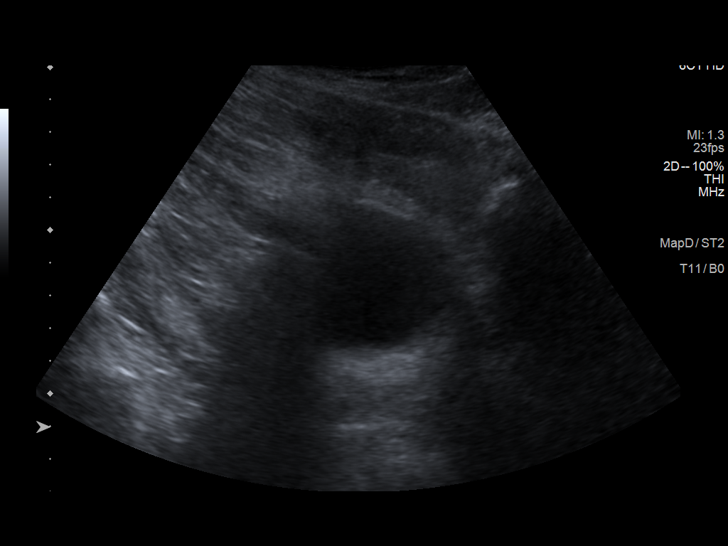

[14 of 23 positions shown; findings below may reference images not displayed]

FINDINGS: Right Kidney:

Renal measurements: 10.5 x 5.8 x 5.7 cm = volume: 179.4 mL.
Echogenicity within normal limits. No mass or hydronephrosis
visualized.

Left Kidney:

Renal measurements: 10.9 x 5.4 x 5.2 cm = volume: 161.0 mL.
Echogenicity within normal limits. No mass or hydronephrosis
visualized.

Bladder:

Appears normal for degree of bladder distention. The bladder is
nearly empty.

Other:

Parenchymal detail mildly limited by body habitus.
IMPRESSION: Unremarkable renal ultrasound.  No hydronephrosis.

## 2022-05-15 IMAGING — CT CT RENAL STONE PROTOCOL
2 of 4 series · 16 of 46 positions shown, 18 images · non-contrast
Comparison: 10/31/2020

CLINICAL DATA: Right flank pain, history of nephrolithiasis

EXAM:
CT ABDOMEN AND PELVIS WITHOUT CONTRAST
TECHNIQUE: Multidetector CT imaging of the abdomen and pelvis was performed
following the standard protocol without IV contrast.

[Series 2: stone full standard · axial · 0.98mm/px · z∈[-640,-160]mm · 13 of 106 slices shown, 15 images]
[im 5/106  soft-tissue]
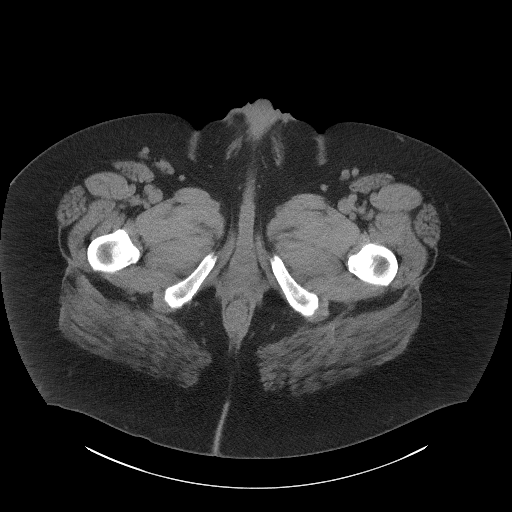
[im 5/106  bone]
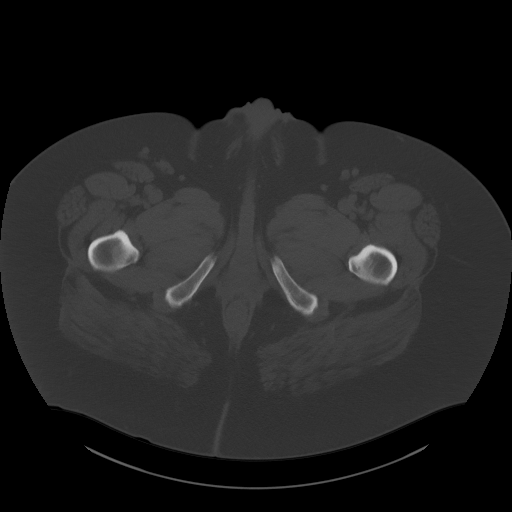
[im 15/106  soft-tissue]
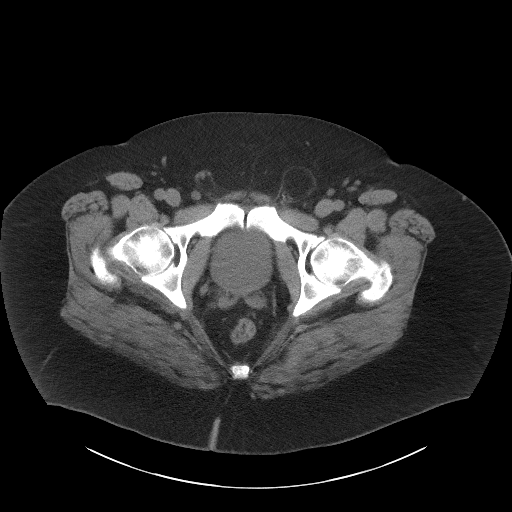
[im 24/106  soft-tissue]
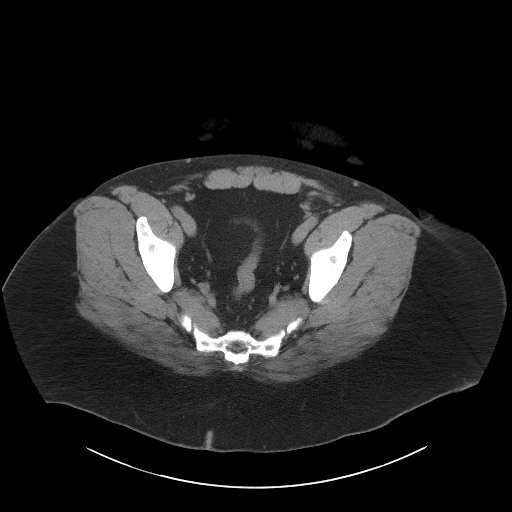
[im 29/106  soft-tissue]
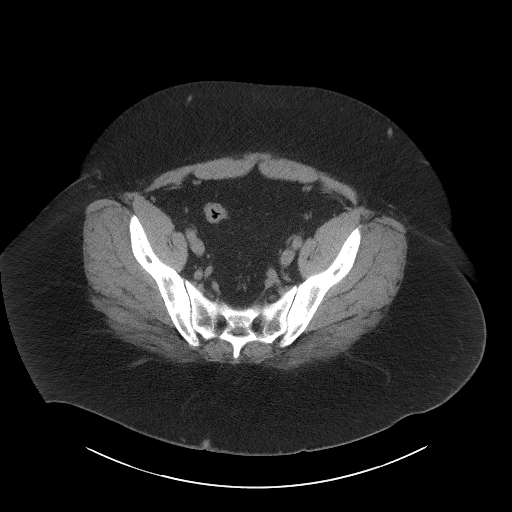
[im 39/106  soft-tissue]
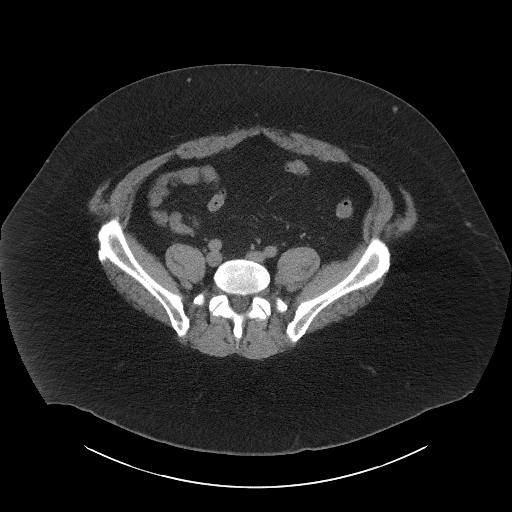
[im 43/106  soft-tissue]
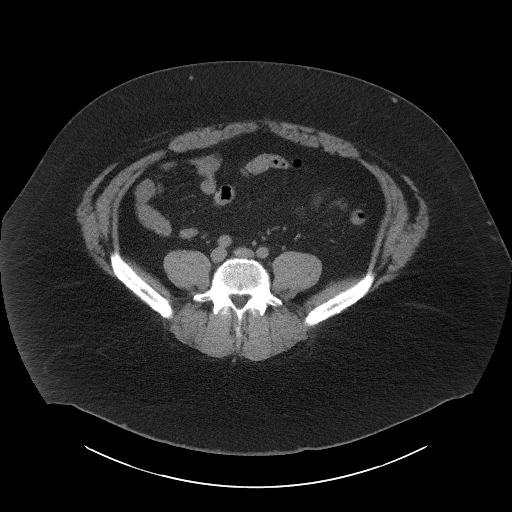
[im 53/106  soft-tissue]
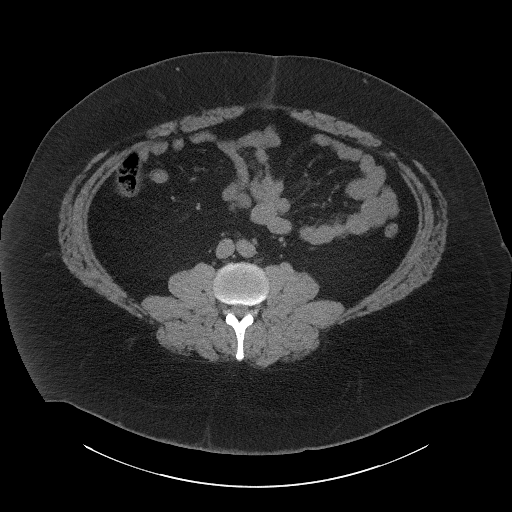
[im 63/106  soft-tissue]
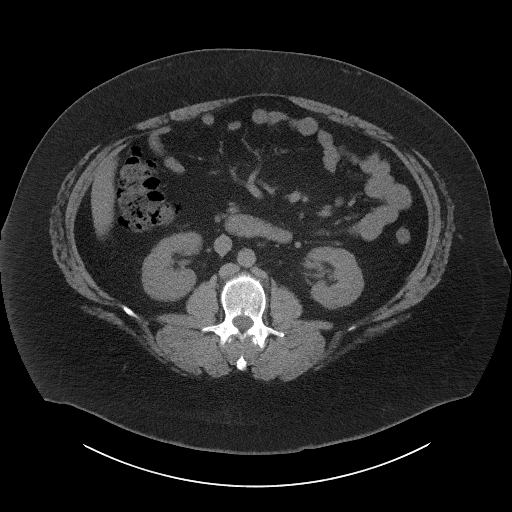
[im 67/106  soft-tissue]
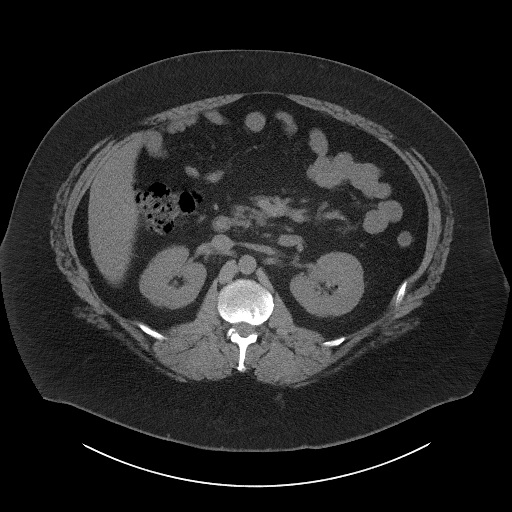
[im 67/106  bone]
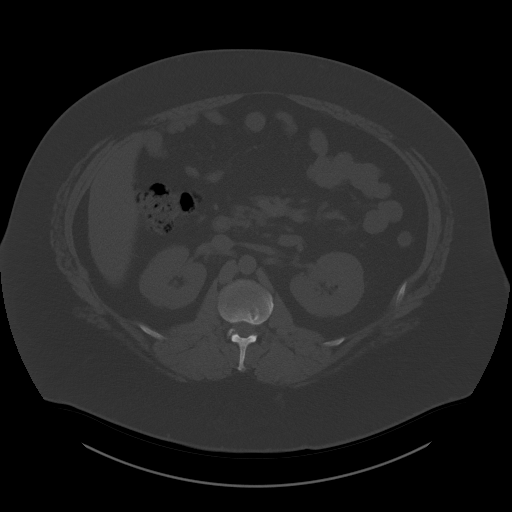
[im 77/106  soft-tissue]
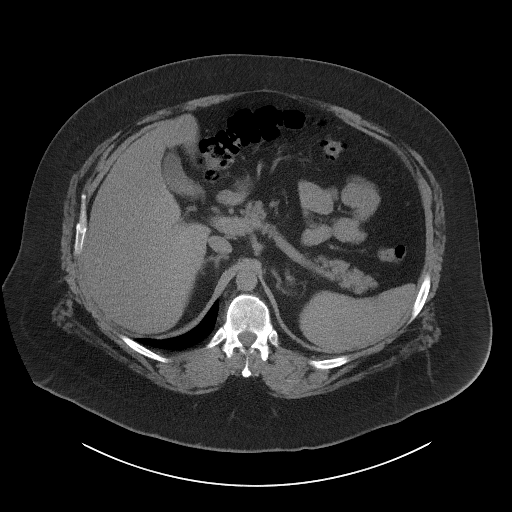
[im 82/106  soft-tissue]
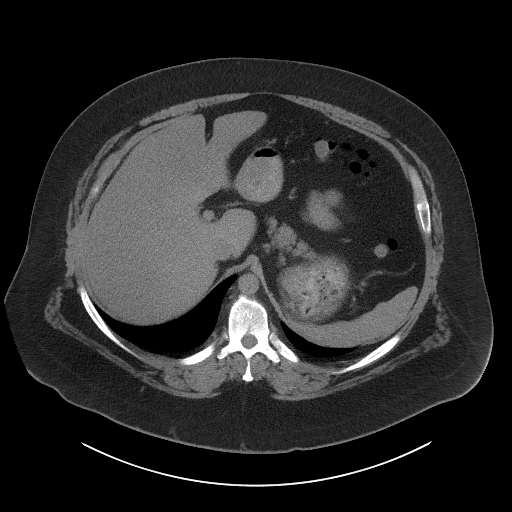
[im 91/106  soft-tissue]
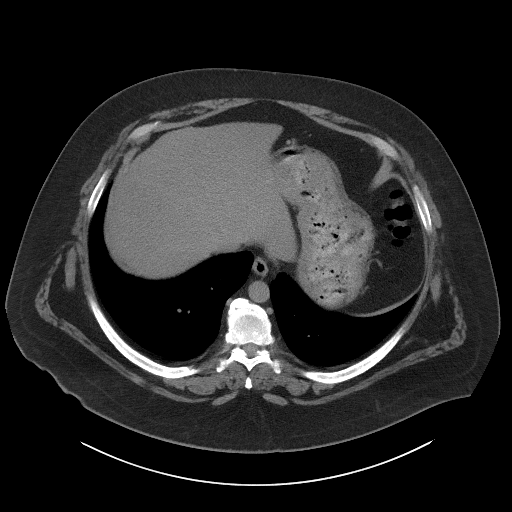
[im 101/106  soft-tissue]
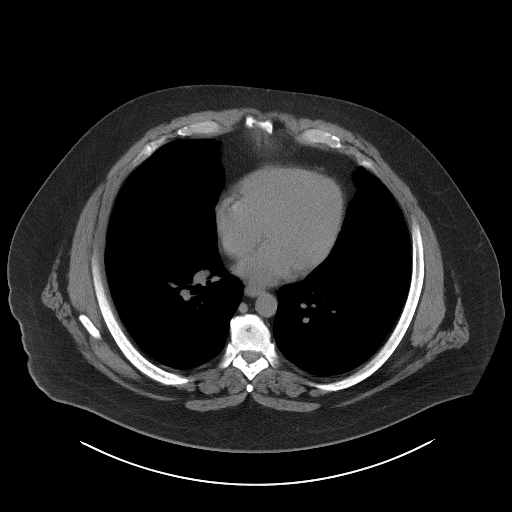

[Series 5: coronal · coronal · 0.91mm/px · 3 of 202 slices shown]
[im 68/202  soft-tissue]
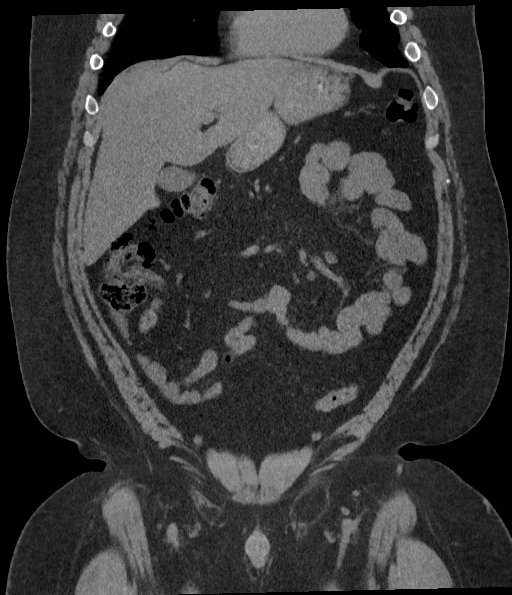
[im 90/202  soft-tissue]
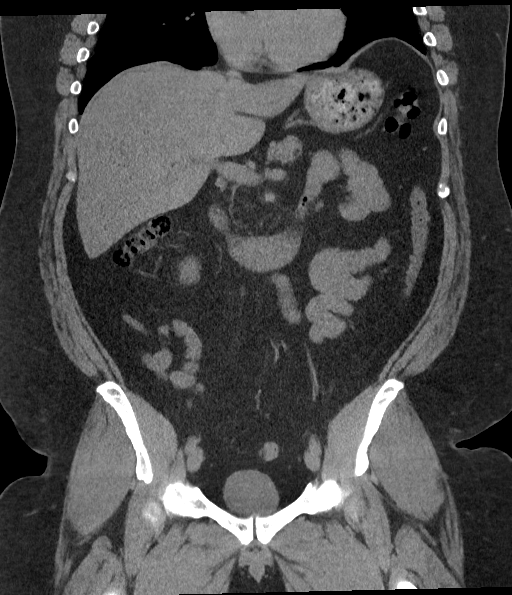
[im 112/202  soft-tissue]
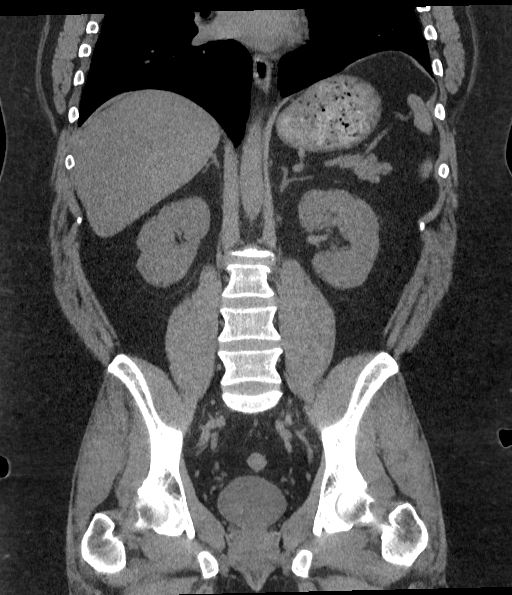

[16 of 46 positions shown; findings below may reference images not displayed]

FINDINGS: Lower chest: No pleural or pericardial effusion. Visualized lung
bases clear.

Hepatobiliary: No focal liver abnormality is seen. No gallstones,
gallbladder wall thickening, or biliary dilatation.

Pancreas: Unremarkable. No pancreatic ductal dilatation or
surrounding inflammatory changes.

Spleen: Normal in size without focal abnormality.

Adrenals/Urinary Tract: Unremarkable adrenal glands. No
hydronephrosis. 1 mm calculus, mid left renal collecting system
image 115/5. Urinary bladder is incompletely distended.

Stomach/Bowel: Stomach physiologically distended by ingested
material. Small bowel is nondilated. Normal appendix. The colon is
nondilated, unremarkable.

Vascular/Lymphatic: No significant vascular findings are present. No
enlarged abdominal or pelvic lymph nodes.

Reproductive: Prostate is unremarkable.

Other: Bilateral pelvic phleboliths.  No ascites.  No free air.

Musculoskeletal: Obesity. Anterior vertebral endplate spurring at
multiple levels in the lower thoracic spine. Bilateral hip DJD.
IMPRESSION: 1. No hydronephrosis, ureteral calculus, or other acute finding.
2. Left  nonobstructive urolithiasis.

## 2022-07-19 DIAGNOSIS — Z87442 Personal history of urinary calculi: Secondary | ICD-10-CM | POA: Diagnosis not present

## 2022-07-19 DIAGNOSIS — R7989 Other specified abnormal findings of blood chemistry: Secondary | ICD-10-CM | POA: Diagnosis not present

## 2022-07-19 DIAGNOSIS — F418 Other specified anxiety disorders: Secondary | ICD-10-CM | POA: Diagnosis not present

## 2022-07-19 DIAGNOSIS — E559 Vitamin D deficiency, unspecified: Secondary | ICD-10-CM | POA: Diagnosis not present

## 2022-10-19 DIAGNOSIS — E559 Vitamin D deficiency, unspecified: Secondary | ICD-10-CM | POA: Diagnosis not present

## 2022-10-19 DIAGNOSIS — H6693 Otitis media, unspecified, bilateral: Secondary | ICD-10-CM | POA: Diagnosis not present

## 2022-10-19 DIAGNOSIS — R7989 Other specified abnormal findings of blood chemistry: Secondary | ICD-10-CM | POA: Diagnosis not present
